# Patient Record
Sex: Female | Born: 1970 | Race: White | Hispanic: No | Marital: Single | Smoking: Never smoker
Health system: Southern US, Community
[De-identification: ages and names within clinical notes are randomized; demographics above are authoritative.]

## PROBLEM LIST (undated history)

## (undated) DIAGNOSIS — F419 Anxiety disorder, unspecified: Secondary | ICD-10-CM

## (undated) DIAGNOSIS — R32 Unspecified urinary incontinence: Secondary | ICD-10-CM

## (undated) DIAGNOSIS — N870 Mild cervical dysplasia: Secondary | ICD-10-CM

## (undated) DIAGNOSIS — N840 Polyp of corpus uteri: Secondary | ICD-10-CM

## (undated) DIAGNOSIS — E349 Endocrine disorder, unspecified: Secondary | ICD-10-CM

## (undated) DIAGNOSIS — G43909 Migraine, unspecified, not intractable, without status migrainosus: Secondary | ICD-10-CM

## (undated) DIAGNOSIS — IMO0002 Reserved for concepts with insufficient information to code with codable children: Secondary | ICD-10-CM

## (undated) HISTORY — DX: Endocrine disorder, unspecified: E34.9

## (undated) HISTORY — DX: Anxiety disorder, unspecified: F41.9

## (undated) HISTORY — PX: WISDOM TOOTH EXTRACTION: SHX21

## (undated) HISTORY — DX: Migraine, unspecified, not intractable, without status migrainosus: G43.909

## (undated) HISTORY — DX: Reserved for concepts with insufficient information to code with codable children: IMO0002

## (undated) HISTORY — DX: Polyp of corpus uteri: N84.0

## (undated) HISTORY — DX: Mild cervical dysplasia: N87.0

## (undated) HISTORY — DX: Unspecified urinary incontinence: R32

---

## 2002-04-23 ENCOUNTER — Other Ambulatory Visit: Admission: RE | Admit: 2002-04-23 | Discharge: 2002-04-23 | Payer: Self-pay | Admitting: Obstetrics and Gynecology

## 2003-04-29 ENCOUNTER — Other Ambulatory Visit: Admission: RE | Admit: 2003-04-29 | Discharge: 2003-04-29 | Payer: Self-pay | Admitting: Obstetrics and Gynecology

## 2004-05-04 ENCOUNTER — Other Ambulatory Visit: Admission: RE | Admit: 2004-05-04 | Discharge: 2004-05-04 | Payer: Self-pay | Admitting: Obstetrics and Gynecology

## 2005-07-08 ENCOUNTER — Other Ambulatory Visit: Admission: RE | Admit: 2005-07-08 | Discharge: 2005-07-08 | Payer: Self-pay | Admitting: Obstetrics and Gynecology

## 2007-02-06 ENCOUNTER — Other Ambulatory Visit: Admission: RE | Admit: 2007-02-06 | Discharge: 2007-02-06 | Payer: Self-pay | Admitting: Obstetrics and Gynecology

## 2010-05-06 ENCOUNTER — Ambulatory Visit (HOSPITAL_COMMUNITY)
Admission: RE | Admit: 2010-05-06 | Discharge: 2010-05-06 | Disposition: A | Discharge status: Home / Self Care | Payer: 59 | Source: Ambulatory Visit | Attending: Family Medicine | Admitting: Family Medicine

## 2010-05-06 ENCOUNTER — Encounter: Payer: Self-pay | Admitting: Family Medicine

## 2010-05-06 DIAGNOSIS — M7989 Other specified soft tissue disorders: Secondary | ICD-10-CM | POA: Insufficient documentation

## 2010-05-06 DIAGNOSIS — M79609 Pain in unspecified limb: Secondary | ICD-10-CM | POA: Insufficient documentation

## 2011-02-16 ENCOUNTER — Ambulatory Visit (INDEPENDENT_AMBULATORY_CARE_PROVIDER_SITE_OTHER): Payer: 59

## 2011-02-16 DIAGNOSIS — Z13220 Encounter for screening for lipoid disorders: Secondary | ICD-10-CM

## 2011-02-16 DIAGNOSIS — Z139 Encounter for screening, unspecified: Secondary | ICD-10-CM

## 2011-02-16 DIAGNOSIS — M999 Biomechanical lesion, unspecified: Secondary | ICD-10-CM

## 2011-06-17 ENCOUNTER — Other Ambulatory Visit: Payer: Self-pay | Admitting: Family Medicine

## 2011-06-17 ENCOUNTER — Ambulatory Visit (INDEPENDENT_AMBULATORY_CARE_PROVIDER_SITE_OTHER): Payer: 59 | Admitting: Family Medicine

## 2011-06-17 VITALS — BP 103/64 | HR 89 | Temp 98.1°F | Resp 18 | Ht 67.0 in | Wt 126.8 lb

## 2011-06-17 DIAGNOSIS — Z111 Encounter for screening for respiratory tuberculosis: Secondary | ICD-10-CM

## 2011-06-17 DIAGNOSIS — Z130 Encounter for screening for diseases of the blood and blood-forming organs and certain disorders involving the immune mechanism: Secondary | ICD-10-CM

## 2011-06-17 DIAGNOSIS — Z13228 Encounter for screening for other metabolic disorders: Secondary | ICD-10-CM

## 2011-06-17 DIAGNOSIS — Z0000 Encounter for general adult medical examination without abnormal findings: Secondary | ICD-10-CM

## 2011-06-17 DIAGNOSIS — Z1231 Encounter for screening mammogram for malignant neoplasm of breast: Secondary | ICD-10-CM

## 2011-06-17 LAB — POCT CBC
Granulocyte percent: 66.8 %G (ref 37–80)
HCT, POC: 42.6 % (ref 37.7–47.9)
Hemoglobin: 13.5 g/dL (ref 12.2–16.2)
Lymph, poc: 1.9 (ref 0.6–3.4)
MCH, POC: 30.5 pg (ref 27–31.2)
MCHC: 31.7 g/dL — AB (ref 31.8–35.4)
MCV: 96.3 fL (ref 80–97)
MID (cbc): 0.5 (ref 0–0.9)
MPV: 10.1 fL (ref 0–99.8)
POC Granulocyte: 4.8 (ref 2–6.9)
POC LYMPH PERCENT: 26.2 %L (ref 10–50)
POC MID %: 7 %M (ref 0–12)
Platelet Count, POC: 246 10*3/uL (ref 142–424)
RBC: 4.42 M/uL (ref 4.04–5.48)
RDW, POC: 13.1 %
WBC: 7.2 10*3/uL (ref 4.6–10.2)

## 2011-06-17 LAB — POCT URINALYSIS DIPSTICK
Bilirubin, UA: NEGATIVE
Glucose, UA: NEGATIVE
Ketones, UA: NEGATIVE
Leukocytes, UA: NEGATIVE
Nitrite, UA: NEGATIVE
Protein, UA: NEGATIVE
Spec Grav, UA: 1.01
Urobilinogen, UA: 0.2
pH, UA: 7

## 2011-06-17 NOTE — Progress Notes (Signed)
[facility]:  Office Visit  Chief Complaint:  Chief Complaint  Patient presents with  . Annual Exam    HPI: Ashley [name] is a 41 y.o. female who complains of:  1. 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 Also needs labs for annual without pap, last pap was normal 01/2011,  10/15/09  Past Medical History  Diagnosis Date  . Migraines    History reviewed. No pertinent past surgical history. History   Social History  . Marital Status: Unknown    Spouse Name: N/A    Number of Children: N/A  . Years of Education: N/A   Social History Main Topics  . Smoking status: Never Smoker   . Smokeless tobacco: None  . Alcohol Use: No  . Drug Use: No  . Sexually Active: None   Other Topics Concern  . None   Social History Narrative  . None   Family History  Problem Relation Age of Onset  . Bipolar disorder Mother   . Parkinsonism Father    Allergies not on file Prior to Admission medications   Not on File     ROS: The patient denies fevers, chills, night sweats, unintentional weight loss, chest pain, palpitations, wheezing, dyspnea on exertion, nausea, vomiting, abdominal pain, dysuria, hematuria, melena, numbness, weakness, or tingling.  All other systems have been reviewed and were otherwise negative with the exception of those mentioned in the HPI and as above.    PHYSICAL EXAM: Filed Vitals:   06/17/11 1307  BP: 103/64  Pulse: 89  Temp: 98.1 F (36.7 C)  Resp: 18   Filed Vitals:   06/17/11 1307  Height: 5\' 7"  (1.702 m)  Weight: 126 lb 12.8 oz (57.516 kg)   Body mass index is 19.86 kg/(m^2).  General: Alert, no acute distress HEENT:  Normocephalic, atraumatic, oropharynx patent. EOMI, PERRLA, fundoscopic exam nl.  Cardiovascular:  Regular rate and rhythm, no rubs murmurs or gallops.  No Carotid bruits, radial pulse intact. No pedal edema.  Respiratory: Clear to auscultation bilaterally.  No wheezes, rales, or rhonchi.   No cyanosis, no use of accessory musculature GI: No organomegaly, abdomen is soft and non-tender, positive bowel sounds.  No masses. Skin: No rashes. Neurologic: Facial musculature symmetric. Psychiatric: Patient is appropriate throughout our interaction. Lymphatic: No cervical lymphadenopathy.  Musculoskeletal: Gait intact. GU/Breast exam deferred   LABS: Results for orders placed in visit on 06/17/11  POCT CBC      Component Value Range   WBC 7.2  4.6 - 10.2 (K/uL)   Lymph, poc 1.9  0.6 - 3.4    POC LYMPH PERCENT 26.2  10 - 50 (%L)   MID (cbc) 0.5  0 - 0.9    POC MID % 7.0  0 - 12 (%M)   POC Granulocyte 4.8  2 - 6.9    Granulocyte percent 66.8  37 - 80 (%G)   RBC 4.42  4.04 - 5.48 (M/uL)   Hemoglobin 13.5  12.2 - 16.2 (g/dL)   HCT, POC 16.1  09.6 - 47.9 (%)   MCV 96.3  80 - 97 (fL)   MCH, POC 30.5  27 - 31.2 (pg)   MCHC 31.7 (*) 31.8 - 35.4 (g/dL)   RDW, POC 04.5     Platelet Count, POC 246  142 - 424 (K/uL)   MPV 10.1  0 - 99.8 (fL)  POCT URINALYSIS DIPSTICK      Component Value Range   Color, UA yellow  Clarity, UA clear     Glucose, UA neg     Bilirubin, UA neg     Ketones, UA neg     Spec Grav, UA 1.010     Blood, UA trace     pH, UA 7.0     Protein, UA neg     Urobilinogen, UA 0.2     Nitrite, UA neg     Leukocytes, UA Negative       EKG/XRAY:   Primary read interpreted by Dr. Conley Rolls at [facility].   ASSESSMENT/PLAN: Encounter Diagnoses  Name Primary?  . Annual physical exam Yes  . Screening-pulmonary TB   . Screening for other and unspecified endocrine, nutritional, metabolic, and immunity disorders    Pending labs CMP, Hep B surface Ab Patient to return for TB results in 48-72 hours Screening mammogram-telephone number given to patient. She will make her own appt Forms filled out for MA school    Heena Woodbury PHUONG, DO 06/17/2011 3:00 PM

## 2011-06-18 LAB — COMPREHENSIVE METABOLIC PANEL
ALT: 48 U/L — ABNORMAL HIGH (ref 0–35)
AST: 38 U/L — ABNORMAL HIGH (ref 0–37)
Albumin: 4.5 g/dL (ref 3.5–5.2)
Alkaline Phosphatase: 60 U/L (ref 39–117)
Glucose, Bld: 87 mg/dL (ref 70–99)
Potassium: 4.1 mEq/L (ref 3.5–5.3)
Sodium: 137 mEq/L (ref 135–145)
Total Bilirubin: 0.4 mg/dL (ref 0.3–1.2)
Total Protein: 6.8 g/dL (ref 6.0–8.3)

## 2011-06-18 LAB — COMPREHENSIVE METABOLIC PANEL WITH GFR
BUN: 11 mg/dL (ref 6–23)
CO2: 28 meq/L (ref 19–32)
Calcium: 9.6 mg/dL (ref 8.4–10.5)
Chloride: 101 meq/L (ref 96–112)
Creat: 0.73 mg/dL (ref 0.50–1.10)

## 2011-06-18 LAB — HEPATITIS B SURFACE ANTIBODY, QUANTITATIVE: Hep B S AB Quant (Post): 5.5 m[IU]/mL

## 2011-06-20 ENCOUNTER — Ambulatory Visit (INDEPENDENT_AMBULATORY_CARE_PROVIDER_SITE_OTHER): Payer: 59 | Admitting: Physician Assistant

## 2011-06-20 ENCOUNTER — Encounter: Payer: Self-pay | Admitting: Physician Assistant

## 2011-06-20 VITALS — BP 99/64 | HR 71 | Temp 97.8°F | Resp 16 | Ht 67.0 in | Wt 127.0 lb

## 2011-06-20 DIAGNOSIS — Z23 Encounter for immunization: Secondary | ICD-10-CM

## 2011-06-20 DIAGNOSIS — Z111 Encounter for screening for respiratory tuberculosis: Secondary | ICD-10-CM

## 2011-06-20 DIAGNOSIS — R7989 Other specified abnormal findings of blood chemistry: Secondary | ICD-10-CM

## 2011-06-20 LAB — TB SKIN TEST
Induration: 0
TB Skin Test: NEGATIVE mm

## 2011-06-20 NOTE — Progress Notes (Signed)
Patient presents for PPD reading and to pick up results of recent immunization review/completed forms for school.    The titer reveals lack of immunity to Hep B.  She has has 3 doses of vaccine, but there were several years between the second and third doses.  Additionally, she notes that there was a problem with the results of her last pap test, performed by Dr. Patsy Lager in 01/2011. She requests those today.  O: Vital signs noted. Well-developed, well nourished WF who is awake, alert and oriented, in NAD. HEENT: [place]/AT, sclera and conjunctiva are clear.   Skin: warm and dry without rash.  PPD 0 mm induration-Negative.  Pap results located.  ASCUS, HPV Negative, GC Negative, CT Negative.  A/P: 1. TB screening-negative. 2. Hepatitis B vaccine given as booster. Repeat titer in 4 weeks.  If still not immune, give 2 additional doses of vaccine and repeat titer. 3. Elevated LFTs. As the elevations are mild, we will repeat them at the 4 week repeat titer visit.  If still elevated, pursue additional evaluation.

## 2011-06-20 NOTE — Patient Instructions (Signed)
We'll repeat your CMET to re-check your liver enzymes when we re-peat the Hepatitis B titer in 4 weeks.  If they are still elevated, we will investigate further.

## 2011-06-22 ENCOUNTER — Ambulatory Visit
Admission: RE | Admit: 2011-06-22 | Discharge: 2011-06-22 | Disposition: A | Discharge status: Home / Self Care | Payer: 59 | Source: Ambulatory Visit | Attending: Family Medicine | Admitting: Family Medicine

## 2011-06-22 DIAGNOSIS — Z1231 Encounter for screening mammogram for malignant neoplasm of breast: Secondary | ICD-10-CM

## 2011-06-24 ENCOUNTER — Encounter: Payer: 59 | Admitting: Family Medicine

## 2011-07-08 ENCOUNTER — Encounter: Payer: 59 | Admitting: Family Medicine

## 2011-09-10 ENCOUNTER — Ambulatory Visit: Payer: Self-pay | Admitting: Physician Assistant

## 2011-09-10 VITALS — BP 108/62 | HR 78 | Temp 98.1°F | Resp 16 | Ht 67.0 in | Wt 127.2 lb

## 2011-09-10 DIAGNOSIS — R7401 Elevation of levels of liver transaminase levels: Secondary | ICD-10-CM

## 2011-09-10 DIAGNOSIS — Z113 Encounter for screening for infections with a predominantly sexual mode of transmission: Secondary | ICD-10-CM

## 2011-09-10 DIAGNOSIS — R7402 Elevation of levels of lactic acid dehydrogenase (LDH): Secondary | ICD-10-CM

## 2011-09-10 DIAGNOSIS — R748 Abnormal levels of other serum enzymes: Secondary | ICD-10-CM

## 2011-09-10 LAB — COMPREHENSIVE METABOLIC PANEL
ALT: 27 U/L (ref 0–35)
AST: 25 U/L (ref 0–37)
Albumin: 4.4 g/dL (ref 3.5–5.2)
Alkaline Phosphatase: 59 U/L (ref 39–117)
BUN: 8 mg/dL (ref 6–23)
CO2: 28 mEq/L (ref 19–32)
Calcium: 9.5 mg/dL (ref 8.4–10.5)
Chloride: 100 mEq/L (ref 96–112)
Creat: 0.72 mg/dL (ref 0.50–1.10)
Glucose, Bld: 81 mg/dL (ref 70–99)
Potassium: 4.4 mEq/L (ref 3.5–5.3)
Sodium: 139 mEq/L (ref 135–145)
Total Bilirubin: 0.5 mg/dL (ref 0.3–1.2)
Total Protein: 6.6 g/dL (ref 6.0–8.3)

## 2011-09-10 NOTE — Progress Notes (Signed)
  Subjective:    Patient ID: Ashley [name], female    DOB: [birth year], 41 y.o.   MRN: [id number]  HPI Patient presents for Hepatits B repeat titer. She had one in 2011 that showed she was not immune. Has since had the series, although there was 1 year between first and second doses. 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    Also wants her ears checked because she has noticed ear wax on stethoscope. Denies otalgia or URI symptoms.   She also would like repeat of CMET due to slightly elevated LFT's at last complete physical.     Review of Systems  All other systems reviewed and are negative.       Objective:   Physical Exam  Constitutional: She is oriented to person, place, and time. She appears well-developed and well-nourished.  HENT:  Head: Normocephalic and atraumatic.  Right Ear: Hearing, tympanic membrane, external ear and ear canal normal.  Left Ear: Hearing, tympanic membrane, external ear and ear canal normal.  Mouth/Throat: No oropharyngeal exudate.       Minimal cerumen bilaterally. No impaction.   Neck: Normal range of motion.  Cardiovascular: Normal rate, regular rhythm and normal heart sounds.   Pulmonary/Chest: Effort normal and breath sounds normal.  Lymphadenopathy:    She has no cervical adenopathy.  Neurological: She is alert and oriented to person, place, and time.  Psychiatric: She has a normal mood and affect. Her behavior is normal. Judgment and thought content normal.          Assessment & Plan:   1. Elevated liver enzymes  Will repeat CMET today.  Comprehensive metabolic panel  2. Screening examination for venereal disease  Hepatitis B surface antibody

## 2011-09-11 LAB — HEPATITIS B SURFACE ANTIBODY, QUANTITATIVE: Hep B S AB Quant (Post): 1000 m[IU]/mL

## 2011-10-01 ENCOUNTER — Encounter: Payer: Self-pay | Admitting: Family Medicine

## 2011-10-15 ENCOUNTER — Telehealth: Payer: Self-pay

## 2011-10-15 ENCOUNTER — Ambulatory Visit: Payer: Self-pay | Admitting: Emergency Medicine

## 2011-10-15 VITALS — BP 102/57 | HR 82 | Temp 98.6°F | Resp 16 | Ht 67.5 in | Wt 125.0 lb

## 2011-10-15 DIAGNOSIS — N926 Irregular menstruation, unspecified: Secondary | ICD-10-CM

## 2011-10-15 LAB — POCT URINALYSIS DIPSTICK
Bilirubin, UA: NEGATIVE
Ketones, UA: NEGATIVE
Leukocytes, UA: NEGATIVE

## 2011-10-15 LAB — POCT UA - MICROSCOPIC ONLY
Bacteria, U Microscopic: NEGATIVE
Casts, Ur, LPF, POC: NEGATIVE

## 2011-10-15 LAB — POCT WET PREP WITH KOH: Clue Cells Wet Prep HPF POC: NEGATIVE

## 2011-10-15 LAB — POCT URINE PREGNANCY: Preg Test, Ur: NEGATIVE

## 2011-10-15 NOTE — Progress Notes (Signed)
  Subjective:    Patient ID: Ashley [name], female    DOB: Mar 31, 1970, 41 y.o.   MRN: [id number]  HPI    Review of Systems     Objective:   Physical Exam  Genitourinary: Uterus normal. Pelvic exam was performed with patient supine. There is no tenderness or lesion on the right labia. There is no tenderness or lesion on the left labia. Cervix exhibits no motion tenderness and no discharge. There is bleeding (minimal spotting) around the vagina. No vaginal discharge found.          Assessment & Plan:   1. Menstrual irregularity  POCT urinalysis dipstick, POCT urine pregnancy, POCT UA - Microscopic Only, POCT Wet Prep with KOH, GC/chlamydia probe amp, genital  Reassurance provided. Will await GC/CL  Recommend follow up with Gyn to further evaluate cause of menstrual irregularity

## 2011-10-15 NOTE — Telephone Encounter (Signed)
PT IS REQUESTING HER LABS FOR HEP B - SHOWING SHE IS IMMUNE PLEASE [phone]

## 2011-10-15 NOTE — Progress Notes (Signed)
  Subjective:    Patient ID: Ashley [name], female    DOB: May 18, 1970, 41 y.o.   MRN: [id number]  HPI 41 year old female presents with menstrual irregularities. Her menstrual cycle started on July 23rd and has persisted. Patient is sexually active and does not currently using any birth control.  She has no children.  Denies pain.  She does also complain of a cough with slight sore throat for several days.  Last complete physical with pap in Dec 2012.     Review of Systems     Objective:   Physical Exam HEENT exam is unremarkable her throat was clear her neck is supple her chest is clear to auscultation and percussion. The abdomen is soft.        Assessment & Plan:

## 2011-10-16 LAB — GC/CHLAMYDIA PROBE AMP, GENITAL: GC Probe Amp, Genital: NEGATIVE

## 2011-10-18 NOTE — Telephone Encounter (Signed)
LMOM that we got her req on Fri and it is waiting for her at front desk. Advised she may CB if she would like [place] to mail it to her instead.

## 2011-10-20 ENCOUNTER — Telehealth: Payer: Self-pay | Admitting: *Deleted

## 2011-10-20 NOTE — Telephone Encounter (Signed)
Pt wanted to know results on G/C, test is back just needs reviewing. Thank you.

## 2011-10-21 NOTE — Telephone Encounter (Signed)
lmom that tests were negative.

## 2011-10-21 NOTE — Telephone Encounter (Signed)
Please let patient know GC/CL negative

## 2013-04-01 DIAGNOSIS — N840 Polyp of corpus uteri: Secondary | ICD-10-CM

## 2013-04-01 DIAGNOSIS — IMO0002 Reserved for concepts with insufficient information to code with codable children: Secondary | ICD-10-CM

## 2013-04-01 HISTORY — DX: Polyp of corpus uteri: N84.0

## 2013-04-01 HISTORY — DX: Reserved for concepts with insufficient information to code with codable children: IMO0002

## 2013-04-04 ENCOUNTER — Ambulatory Visit: Payer: Self-pay | Admitting: Family Medicine

## 2013-04-04 VITALS — BP 100/70 | HR 73 | Temp 97.9°F | Resp 16 | Ht 67.0 in | Wt 126.0 lb

## 2013-04-04 DIAGNOSIS — N920 Excessive and frequent menstruation with regular cycle: Secondary | ICD-10-CM

## 2013-04-04 DIAGNOSIS — N841 Polyp of cervix uteri: Secondary | ICD-10-CM

## 2013-04-04 NOTE — Progress Notes (Signed)
Subjective: [age] year old lady with history of 6 months of off-and-on vaginal bleeding. She can hardly tell when her period starts or in this. She is probably bleeding about 16 days a month as opposed to her usual 5 day periods. No pain. And bleeding frequently after intercourse. No other major symptoms. Wanted a Pap smear today. [facility]. Had Chlamydia last summer. Has no insurance and prefers excessive testing not done.  Objective: Very anxious lady. No CVA tenderness. Abdomen soft without mass or tenderness. Normal external genitalia. Vaginal mucosa unremarkable. Cervix has a very prominent and very large 1.5-2 cm endocervical polyp which is very friable looking. I could not see the os. I decided not to attempt a Pap smear because of the being afraid we would start uncontrolled bleeding. Bimanual exam unremarkable except for the palpable polyp.  Assessment: Large endocervical polyp, friable  Plan: Refer to gynecology and let them remove the polyp and do the Pap smear. Attempted to photograph it for the patient with her phone, but did not do so successfully  Interestingly enough, her twin sister has a history of having one of these.

## 2013-04-04 NOTE — Patient Instructions (Signed)
We are referring you to a gynecologist for evaluation and removal of the endocervical polyp. You should hear from our referral department in the next few days. If for any reason you do not hear from someone by Tuesday please call back and asked to speak to referral  Avoid intercourse until you see the gynecologist.

## 2013-04-20 ENCOUNTER — Other Ambulatory Visit (HOSPITAL_COMMUNITY)
Admission: RE | Admit: 2013-04-20 | Discharge: 2013-04-20 | Disposition: A | Discharge status: Home / Self Care | Payer: Medicaid Other | Source: Ambulatory Visit | Attending: Gynecology | Admitting: Gynecology

## 2013-04-20 ENCOUNTER — Encounter: Payer: Self-pay | Admitting: Gynecology

## 2013-04-20 ENCOUNTER — Ambulatory Visit (INDEPENDENT_AMBULATORY_CARE_PROVIDER_SITE_OTHER): Payer: Medicaid Other | Admitting: Gynecology

## 2013-04-20 ENCOUNTER — Telehealth: Payer: Self-pay | Admitting: *Deleted

## 2013-04-20 VITALS — BP 100/68 | Ht 67.0 in | Wt 125.0 lb

## 2013-04-20 DIAGNOSIS — N926 Irregular menstruation, unspecified: Secondary | ICD-10-CM

## 2013-04-20 DIAGNOSIS — Z124 Encounter for screening for malignant neoplasm of cervix: Secondary | ICD-10-CM | POA: Insufficient documentation

## 2013-04-20 DIAGNOSIS — N841 Polyp of cervix uteri: Secondary | ICD-10-CM

## 2013-04-20 DIAGNOSIS — Z1151 Encounter for screening for human papillomavirus (HPV): Secondary | ICD-10-CM | POA: Insufficient documentation

## 2013-04-20 DIAGNOSIS — N63 Unspecified lump in unspecified breast: Secondary | ICD-10-CM

## 2013-04-20 DIAGNOSIS — N6310 Unspecified lump in the right breast, unspecified quadrant: Secondary | ICD-10-CM

## 2013-04-20 NOTE — Patient Instructions (Signed)
Office will call you with the pathology report from the polyp. Office will call you to arrange for mammogram and ultrasound of the right breast mass.

## 2013-04-20 NOTE — Progress Notes (Signed)
Patient ID: Ashley [name] Baby, female   DOB: 1970/07/06, 43 y.o.   MRN: [id number] Ashley [name] Clyne 1970/07/06 [id number]    43 y.o.  G0P0 new patient seen in consultation from Dr. Alwyn RenHopper. She saw him complaining of irregular bleeding and on his exam a large cervical polyp was noted and she was referred for further evaluation. Patient notes regular menses monthly but spotting in between over the last several months. No pain or other symptoms.  Past medical history,surgical history, problem list, medications, allergies, family history and social history were all reviewed and documented in the EPIC chart.  ROS:  Performed and pertinent positives and negatives are included in the history, assessment and plan .  Exam: Sherrilyn RistKari assistant Filed Vitals:   04/20/13 1441  BP: 100/68  Height: 5\' 7"  (1.702 m)  Weight: 125 lb (56.7 kg)   General appearance  Normal Skin grossly normal Head/Neck normal with no cervical or supraclavicular adenopathy thyroid normal Lungs  clear Cardiac RR, without RMG Abdominal  soft, nontender, without masses, organomegaly or hernia Breasts  examined lying and sitting. Left without masses, retractions, discharge or axillary adenopathy. Right with firm well-circumscribed 1.5 cm nodular mass 3 to 4:00 position 1 fingerbreadth off the nipple. No overlying skin changes, nipple discharge or axillary adenopathy. Physical Exam  Respiratory:       Pelvic  Ext/BUS/vagina  normal  Cervix  with large endocervical polyp extruding from the os. Pap smear done.  Endocervical polyp was grasped with a ring forcep and removed without difficulty. Scant bleeding afterwards. Specimen sent to pathology.  Uterus  anteverted, normal size, shape and contour, midline and mobile nontender   Adnexa  Without masses or tenderness    Anus and perineum  Normal   Rectovaginal  Normal sphincter tone without palpated masses or tenderness.    Assessment/Plan:  43 y.o. G0P0 new  patient  1. Endocervical polyp. I think this accounts for her irregular spotting between her cycles. Recommend keeping a menstrual calendar now and if regular menses without spotting will follow. If irregular spotting continues then she'll represent for further evaluation rule out endometrial abnormalities. Patient will followup for the pathology results from the biopsy as well as her Pap smear results. 2. Right breast mass as outlined above. Feels like a fibroadenoma. Patient notes in retrospect that she had noticed this before.  Last mammogram 05/2011 normal. We'll start with diagnostic mammography and ultrasound. The importance of followup was stressed to her and she knows my office will arrange the appointment. 3. Pap smear 2013. Pap smear repeated today. She gives no history of abnormal Pap smears 4. Contraception. Patient using condoms. Failure risks discussed and availability of plan B. reviewed. Discussed alternatives to include pill patch ring Depo-Provera IUD. Patient had tried birth control pills in the past but did not like the way hormones make her feel. She is comfortable continuing with the condoms accepting the failure risk. 5. Health maintenance. No lab work done as this is all done through her primary physician'[name] office. Followup for pathology results and Pap smear. Followup if irregular bleeding continues.   Note: This document was prepared with digital dictation and possible smart phrase technology. Any transcriptional errors that result from this process are unintentional.   Dara LordsFONTAINE,Laylani Pudwill P MD, 3:25 PM 04/20/2013

## 2013-04-20 NOTE — Addendum Note (Signed)
Addended by: Richardson ChiquitoWILKINSON, KARI S on: 04/20/2013 03:40 PM   Modules accepted: Orders

## 2013-04-20 NOTE — Telephone Encounter (Signed)
Orders placed breast center will contact pt to schedule.  

## 2013-04-20 NOTE — Telephone Encounter (Signed)
Message copied by Aura CampsWEBB, Querida Beretta L on Fri Apr 20, 2013  3:52 PM ------      Message from: Dara LordsFONTAINE, TIMOTHY P      Created: Fri Apr 20, 2013  3:32 PM       Schedule diagnostic mammography and ultrasound reference right breast mass 3 to 4:00 position off the areola ------

## 2013-04-24 ENCOUNTER — Other Ambulatory Visit: Payer: Self-pay | Admitting: Gynecology

## 2013-04-24 DIAGNOSIS — N840 Polyp of corpus uteri: Secondary | ICD-10-CM

## 2013-04-27 NOTE — Telephone Encounter (Signed)
appt 05/07/13 @ 3:30 pm

## 2013-05-02 ENCOUNTER — Encounter: Payer: Self-pay | Admitting: Gynecology

## 2013-05-02 ENCOUNTER — Telehealth: Payer: Self-pay

## 2013-05-02 NOTE — Telephone Encounter (Signed)
Message copied by Keenan BachelorANNAS, Dinah R on Wed May 02, 2013  4:53 PM ------      Message from: Dara LordsFONTAINE, TIMOTHY P      Created: Wed May 02, 2013  8:35 AM       Tell patient that her Pap smear showed mild atypia. The HPV screen was negative and by protocol we will repeat her Pap smear in one year. It also showed some endometrial cells which I think is from her endometrial polyp that we removed. We will further evaluate when she comes in for the sonohysterogram. ------

## 2013-05-02 NOTE — Telephone Encounter (Signed)
[facility] needed her to call with Day One of menses. She said she has no idea when she will start because periods are so irregular since she has been dealing with irregular bleeding.  I told her I would see what you recommended.

## 2013-05-03 NOTE — Telephone Encounter (Signed)
I would have her call when her period starts. And then we can fit her in for the sonohysterogram. It is not an emergency for the sonohysterogram so if it takes a month or so that's okay  We were also arranging ultrasound mammogram for the lump in her right breast. I want to make sure that she is following up for that also.

## 2013-05-03 NOTE — Telephone Encounter (Signed)
Left detailed message on her cell phone with answer.

## 2013-05-03 NOTE — Telephone Encounter (Signed)
Breast u/s follow-up is scheduled for March 11.  [facility]

## 2013-05-07 ENCOUNTER — Other Ambulatory Visit: Payer: Self-pay

## 2013-05-09 ENCOUNTER — Ambulatory Visit
Admission: RE | Admit: 2013-05-09 | Discharge: 2013-05-09 | Disposition: A | Discharge status: Home / Self Care | Payer: Medicaid Other | Source: Ambulatory Visit | Attending: Gynecology | Admitting: Gynecology

## 2013-05-09 DIAGNOSIS — N6310 Unspecified lump in the right breast, unspecified quadrant: Secondary | ICD-10-CM

## 2013-05-14 ENCOUNTER — Telehealth: Payer: Self-pay | Admitting: *Deleted

## 2013-05-14 NOTE — Telephone Encounter (Signed)
Message copied by Aura CampsWEBB, JENNIFER L on Mon May 14, 2013  8:53 AM ------      Message from: Dara LordsFONTAINE, TIMOTHY P      Created: Wed May 09, 2013  3:14 PM       I would notify radiology that their report impression status left breast cyst but the patient had a right breast mass and I think they confused the side in the final report ------

## 2013-05-14 NOTE — Telephone Encounter (Signed)
Spoke to Ashley [name] at breast center and she will relay to have error fixed.

## 2013-05-16 ENCOUNTER — Telehealth: Payer: Self-pay

## 2013-05-16 NOTE — Telephone Encounter (Signed)
Left message for patient to call me.  Dr. Velvet BatheFOlegario Messier- Kathy at [facility] said there is a note on her chart and she is going to have the radiologist re-dictate the corrected report.

## 2013-05-16 NOTE — Telephone Encounter (Signed)
Yes, we need to call the patient. If she thinks she is pregnant then she should come here to verify that and we can discuss ongoing care. If her menses started and she is not pregnant then she needs to call and followup for the mammogram to complete the evaluation of her breasts. I still do not see where they have corrected the report where the ultrasound mentioned a left breast cyst in the final impression but are describing a right breast cyst on the descriptive part at the report. She did have a right breast mass on clinical exam and again I think a confused the side on final description.

## 2013-05-16 NOTE — Telephone Encounter (Signed)
When patient came for the diagnostic mammogram you ordered she told them there was a chance she was pregnant so they did not do diagnostic mammo they only did u/s of breast.  They advised her that as soon as she found out, if test was negative, she should come for mammo. Olegario MessierKathy was calling today to see if you were aware regarding the possible pregnancy and when she might be having a test. I told her last visit not mentioned.    Olegario MessierKathy suggested I might call patient and inquire regarding pregnancy and encourage her if not pregnant that diagnostic mammo is needed. She said they will be sending her a letter indicating additional views needed.  Do you want me to contact patient?

## 2013-05-17 NOTE — Telephone Encounter (Signed)
Patient called. She said that her period started today. She is ready to schedule [facility]. I stressed to her the importance of having the diagnostic mammo for completeness of that breast study.  She said she will call them back and schedule that. I called the [facility] to let tech, Olegario MessierKathy know.

## 2013-05-21 ENCOUNTER — Other Ambulatory Visit: Payer: Self-pay

## 2013-05-21 ENCOUNTER — Other Ambulatory Visit: Payer: Self-pay | Admitting: Gynecology

## 2013-05-21 DIAGNOSIS — Z1231 Encounter for screening mammogram for malignant neoplasm of breast: Secondary | ICD-10-CM

## 2013-06-01 ENCOUNTER — Ambulatory Visit
Admission: RE | Admit: 2013-06-01 | Discharge: 2013-06-01 | Disposition: A | Discharge status: Home / Self Care | Payer: Medicaid Other | Source: Ambulatory Visit

## 2013-06-01 DIAGNOSIS — Z1231 Encounter for screening mammogram for malignant neoplasm of breast: Secondary | ICD-10-CM

## 2013-06-05 ENCOUNTER — Other Ambulatory Visit: Payer: Self-pay | Admitting: Gynecology

## 2013-06-05 DIAGNOSIS — R928 Other abnormal and inconclusive findings on diagnostic imaging of breast: Secondary | ICD-10-CM

## 2013-06-18 ENCOUNTER — Ambulatory Visit
Admission: RE | Admit: 2013-06-18 | Discharge: 2013-06-18 | Disposition: A | Discharge status: Home / Self Care | Payer: Medicaid Other | Source: Ambulatory Visit | Attending: Gynecology | Admitting: Gynecology

## 2013-06-18 ENCOUNTER — Ambulatory Visit: Payer: Medicaid Other | Admitting: Gynecology

## 2013-06-18 ENCOUNTER — Other Ambulatory Visit: Payer: Medicaid Other

## 2013-06-18 DIAGNOSIS — R928 Other abnormal and inconclusive findings on diagnostic imaging of breast: Secondary | ICD-10-CM

## 2013-06-19 ENCOUNTER — Ambulatory Visit: Payer: Self-pay | Admitting: Physician Assistant

## 2013-06-19 ENCOUNTER — Telehealth: Payer: Self-pay

## 2013-06-19 VITALS — BP 100/58 | HR 60 | Temp 98.1°F | Resp 16 | Ht 66.0 in | Wt 124.6 lb

## 2013-06-19 DIAGNOSIS — N390 Urinary tract infection, site not specified: Secondary | ICD-10-CM

## 2013-06-19 DIAGNOSIS — R350 Frequency of micturition: Secondary | ICD-10-CM

## 2013-06-19 DIAGNOSIS — R109 Unspecified abdominal pain: Secondary | ICD-10-CM

## 2013-06-19 LAB — POCT URINALYSIS DIPSTICK
Bilirubin, UA: NEGATIVE
Glucose, UA: NEGATIVE
Ketones, UA: NEGATIVE
Nitrite, UA: NEGATIVE
Protein, UA: 30
Spec Grav, UA: 1.01
Urobilinogen, UA: 0.2
pH, UA: 7

## 2013-06-19 LAB — POCT UA - MICROSCOPIC ONLY
Casts, Ur, LPF, POC: NEGATIVE
Crystals, Ur, HPF, POC: NEGATIVE
Mucus, UA: NEGATIVE
Yeast, UA: NEGATIVE

## 2013-06-19 MED ORDER — NITROFURANTOIN MONOHYD MACRO 100 MG PO CAPS: 100.0000 mg | ORAL_CAPSULE | Freq: Two times a day (BID) | ORAL | Status: DC

## 2013-06-19 MED ORDER — PHENAZOPYRIDINE HCL 200 MG PO TABS: 200.0000 mg | ORAL_TABLET | Freq: Three times a day (TID) | ORAL | Status: DC | PRN

## 2013-06-19 NOTE — Telephone Encounter (Signed)
Pt gave us a call back, she changed her mind and has decided to do the urine culture. We still have the urine and Maralyn SagoSarah said it was ok to order it. Malvin JohnsFYI Elizabeth

## 2013-06-19 NOTE — Progress Notes (Signed)
Subjective:    Patient ID: Ashley [name], female    DOB: [birth year], 43 y.o.   MRN: [id number]  HPI   Ms. Ashley [name] is a pleasant 43 yr old female here with concern for illness.  Woke up last night with lower abd pain, dysuria.  Some frequency last night, but less so today.  Denies hematuria.  Does not recall any prior UTIs.  +low back pain today.  No FC, NV.  Denies vaginal symptoms.  No concern for STI, declines testing.  Has not used anything for symptoms.     Review of Systems  Constitutional: Negative for fever and chills.  Respiratory: Negative.   Gastrointestinal: Positive for abdominal pain (lower). Negative for nausea and vomiting.  Genitourinary: Positive for dysuria and frequency. Negative for flank pain, vaginal bleeding and vaginal discharge.  Musculoskeletal: Positive for back pain.       Objective:   Physical Exam  Vitals reviewed. Constitutional: She is oriented to person, place, and time. She appears well-developed and well-nourished. No distress.  HENT:  Head: Normocephalic and atraumatic.  Eyes: Conjunctivae are normal. No scleral icterus.  Cardiovascular: Normal rate, regular rhythm and normal heart sounds.   Pulmonary/Chest: Effort normal and breath sounds normal. She has no wheezes. She has no rales.  Abdominal: Soft. Bowel sounds are normal. There is tenderness in the suprapubic area. There is no CVA tenderness.  Neurological: She is alert and oriented to person, place, and time.  Skin: Skin is warm and dry.  Psychiatric: Her mood appears anxious. Her speech is delayed. She is withdrawn.    Results for orders placed in visit on 06/19/13  POCT UA - MICROSCOPIC ONLY      Result Value Ref Range   WBC, Ur, HPF, POC 10-15 with clumps     RBC, urine, microscopic 4-6     Bacteria, U Microscopic trace     Mucus, UA neg     Epithelial cells, urine per micros 0-4     Crystals, Ur, HPF, POC neg     Casts, Ur, LPF, POC neg     Yeast, UA neg    POCT URINALYSIS  DIPSTICK      Result Value Ref Range   Color, UA yellow     Clarity, UA clear     Glucose, UA neg     Bilirubin, UA neg     Ketones, UA neg     Spec Grav, UA 1.010     Blood, UA moderate     pH, UA 7.0     Protein, UA 30     Urobilinogen, UA 0.2     Nitrite, UA neg     Leukocytes, UA moderate (2+)         Assessment & Plan:  UTI (urinary tract infection) - Plan: nitrofurantoin, macrocrystal-monohydrate, (MACROBID) 100 MG capsule, phenazopyridine (PYRIDIUM) 200 MG tablet  Frequent urination - Plan: POCT UA - Microscopic Only, POCT urinalysis dipstick, phenazopyridine (PYRIDIUM) 200 MG tablet  Abdominal pain, unspecified site - Plan: POCT UA - Microscopic Only, POCT urinalysis dipstick   Ms. Ashley [name] is a pleasant 43 yr old female with UTI.  Will treat empirically with macrobid x 5 days.  Pt declines cx due to cost.  May use pyridium if needed for the next 2 days.  Push fluids.  Discussed RTC precautions  Pt to call or RTC if worsening or not improving  E. Frances FurbishElizabeth Rylyn Zawistowski MHS, PA-C [facility] 4/21/20155:26  PM

## 2013-06-19 NOTE — Patient Instructions (Signed)
The antibiotic prescribed today is for your present infection only. It is very important to follow the directions for the medication prescribed. Antibiotics are generally given for a specified period of time (7-10 days, for example) to be taken at specific intervals (every 4, 6, 8 or 12 hours). This is necessary to keep the right amount of the medication in the bloodstream. Too much of the medication may cause an adverse reaction, too little may not be completely effective.  To clear your infection completely, continue taking the antibiotic for the full time of treatment, even if you begin to feel better after a few days.  If you miss a dose of the antibiotic, take it as soon as possible. Then go back to your regular dosing schedule. However, don't double up doses.    Begin taking the antibiotic as directed.  Be sure to finish the full course  Use the Pyridium if needed for symptoms (will turn your urine a red/orange color)  Drink plenty of fluids (water is best!)   Please let us know if any symptoms are worsening or not improving   Urinary Tract Infection Urinary tract infections (UTIs) can develop anywhere along your urinary tract. Your urinary tract is your body's drainage system for removing wastes and extra water. Your urinary tract includes two kidneys, two ureters, a bladder, and a urethra. Your kidneys are a pair of bean-shaped organs. Each kidney is about the size of your fist. They are located below your ribs, one on each side of your spine. CAUSES Infections are caused by microbes, which are microscopic organisms, including fungi, viruses, and bacteria. These organisms are so small that they can only be seen through a microscope. Bacteria are the microbes that most commonly cause UTIs. SYMPTOMS  Symptoms of UTIs may vary by age and gender of the patient and by the location of the infection. Symptoms in young women typically include a frequent and intense urge to urinate and a painful,  burning feeling in the bladder or urethra during urination. Older women and men are more likely to be tired, shaky, and weak and have muscle aches and abdominal pain. A fever may mean the infection is in your kidneys. Other symptoms of a kidney infection include pain in your back or sides below the ribs, nausea, and vomiting. DIAGNOSIS To diagnose a UTI, your caregiver will ask you about your symptoms. Your caregiver also will ask to provide a urine sample. The urine sample will be tested for bacteria and white blood cells. White blood cells are made by your body to help fight infection. TREATMENT  Typically, UTIs can be treated with medication. Because most UTIs are caused by a bacterial infection, they usually can be treated with the use of antibiotics. The choice of antibiotic and length of treatment depend on your symptoms and the type of bacteria causing your infection. HOME CARE INSTRUCTIONS  If you were prescribed antibiotics, take them exactly as your caregiver instructs you. Finish the medication even if you feel better after you have only taken some of the medication.  Drink enough water and fluids to keep your urine clear or pale yellow.  Avoid caffeine, tea, and carbonated beverages. They tend to irritate your bladder.  Empty your bladder often. Avoid holding urine for long periods of time.  Empty your bladder before and after sexual intercourse.  After a bowel movement, women should cleanse from front to back. Use each tissue only once. SEEK MEDICAL CARE IF:   You have back  pain.  You develop a fever.  Your symptoms do not begin to resolve within 3 days. SEEK IMMEDIATE MEDICAL CARE IF:   You have severe back pain or lower abdominal pain.  You develop chills.  You have nausea or vomiting.  You have continued burning or discomfort with urination. MAKE SURE YOU:   Understand these instructions.  Will watch your condition.  Will get help right away if you are not  doing well or get worse. Document Released: 11/25/2004 Document Revised: 08/17/2011 Document Reviewed: 03/26/2011 [facility] Patient Information 2014 [place], [place].

## 2013-06-19 NOTE — Addendum Note (Signed)
Addended by: Johnnette LitterARDWELL, Cassian Torelli M on: 06/19/2013 08:00 PM   Modules accepted: Orders

## 2013-06-20 NOTE — Telephone Encounter (Signed)
Perfect. Thanks.

## 2013-06-22 LAB — URINE CULTURE

## 2013-07-18 ENCOUNTER — Emergency Department (INDEPENDENT_AMBULATORY_CARE_PROVIDER_SITE_OTHER)
Admission: EM | Admit: 2013-07-18 | Discharge: 2013-07-18 | Disposition: A | Discharge status: Home / Self Care | Payer: Self-pay | Source: Home / Self Care

## 2013-07-18 ENCOUNTER — Encounter (HOSPITAL_COMMUNITY): Payer: Self-pay | Admitting: Emergency Medicine

## 2013-07-18 DIAGNOSIS — R3989 Other symptoms and signs involving the genitourinary system: Secondary | ICD-10-CM

## 2013-07-18 DIAGNOSIS — R399 Unspecified symptoms and signs involving the genitourinary system: Secondary | ICD-10-CM

## 2013-07-18 LAB — POCT URINALYSIS DIP (DEVICE)
Bilirubin Urine: NEGATIVE
Glucose, UA: NEGATIVE mg/dL
Hgb urine dipstick: NEGATIVE
KETONES UR: NEGATIVE mg/dL
LEUKOCYTES UA: NEGATIVE
NITRITE: NEGATIVE
PH: 7 (ref 5.0–8.0)
Protein, ur: NEGATIVE mg/dL
Specific Gravity, Urine: 1.01 (ref 1.005–1.030)
Urobilinogen, UA: 0.2 mg/dL (ref 0.0–1.0)

## 2013-07-18 LAB — POCT PREGNANCY, URINE: PREG TEST UR: NEGATIVE

## 2013-07-18 MED ORDER — CIPROFLOXACIN HCL 500 MG PO TABS: 500.0000 mg | ORAL_TABLET | Freq: Two times a day (BID) | ORAL | Status: DC

## 2013-07-18 NOTE — ED Notes (Signed)
Pt  Was  Seen    sev  Weeks  Ago  For  uti           She  Said  She    Continues  To  Have  Symptoms  Of  Frequency  And  Low  abd  Pain

## 2013-07-18 NOTE — ED Provider Notes (Signed)
CSN: [id number]     Arrival date & time 07/18/13  1212 History   First MD Initiated Contact with Patient 07/18/13 1400     Chief Complaint  Patient presents with  . Urinary Frequency   (Consider location/radiation/quality/duration/timing/severity/associated sxs/prior Treatment) HPI Comments: 89 y o F with recent hx of UTI due to E.coli strain susceptible to all tested ABX. Was tx with Macrobid and sx's abated. Recently intercourse and now with urinary sx's of frequency  and slight burning with urination.    Past Medical History  Diagnosis Date  . Migraines   . Anxiety   . ASCUS favor benign 04/2013    Negative high risk HPV screen. Recommend repeat Pap smear/HPV one year  . Endometrial polyp 04/2013    Removed in the office extruding from the cervical os   Past Surgical History  Procedure Laterality Date  . Wisdom tooth extraction Bilateral    Family History  Problem Relation Age of Onset  . Bipolar disorder Mother   . Mental illness Mother   . Parkinsonism Father   . Hypertension Father   . Cancer Paternal Grandmother    History  Substance Use Topics  . Smoking status: Never Smoker   . Smokeless tobacco: Not on file  . Alcohol Use: No   OB History   Grav Para Term Preterm Abortions TAB SAB Ect Mult Living   0              Review of Systems  Constitutional: Negative.  Negative for fever.  Respiratory: Negative.   Gastrointestinal: Negative.   Genitourinary: Positive for dysuria, urgency, frequency and pelvic pain. Negative for vaginal bleeding, vaginal discharge, difficulty urinating and vaginal pain.  Musculoskeletal: Negative.     Allergies  Codeine  Home Medications   Prior to Admission medications   Medication Sig Start Date End Date Taking? Authorizing Provider  Multiple Vitamins-Minerals (MULTIVITAMIN WITH MINERALS) tablet Take 1 tablet by mouth daily.    Historical Provider, MD  nitrofurantoin, macrocrystal-monohydrate, (MACROBID) 100 MG capsule Take  1 capsule (100 mg total) by mouth 2 (two) times daily. 06/19/13   Eleanore Delia ChimesE Egan, PA-C  phenazopyridine (PYRIDIUM) 200 MG tablet Take 1 tablet (200 mg total) by mouth 3 (three) times daily as needed for pain. 06/19/13   Eleanore E Egan, PA-C   BP 121/65  Pulse 80  Temp(Src) 97.7 F (36.5 C) (Oral)  Resp 16  SpO2 99%  LMP 06/11/2013 Physical Exam  Nursing note and vitals reviewed. Constitutional: She is oriented to person, place, and time. She appears well-developed and well-nourished. No distress.  Neck: Normal range of motion. Neck supple.  Pulmonary/Chest: Effort normal. No respiratory distress.  Neurological: She is alert and oriented to person, place, and time. She exhibits normal muscle tone.  Skin: Skin is warm and dry.    ED Course  Procedures (including critical care time) Labs Review Labs Reviewed  POCT URINALYSIS DIP (DEVICE)  POCT PREGNANCY, URINE    Imaging Review No results found.   MDM   1. Urinary tract infection symptoms     Does not want a repeat culture due to cost. She later changed her mind and requested one, thus ordered. Since previous Cand S greatest  In vitro efficacy was Cipro will tx with this x 7 d. Plenty of water.     Hayden Rasmussenavid Armari Fussell, NP 07/18/13 1446  Hayden Rasmussenavid Esias Mory, NP 07/18/13 [phone]

## 2013-07-18 NOTE — ED Notes (Signed)
Patient provided urine specimen while in waiting room. specimen in lab

## 2013-07-19 LAB — URINE CULTURE

## 2013-07-19 NOTE — ED Provider Notes (Signed)
Medical screening examination/treatment/procedure(s) were performed by a resident physician or non-physician practitioner and as the supervising physician I was immediately available for consultation/collaboration.  Euclide Granito, MD    Addilyn Satterwhite S Nasiyah Laverdiere, MD 07/19/13 0751 

## 2013-08-08 ENCOUNTER — Other Ambulatory Visit: Payer: Self-pay | Admitting: Gynecology

## 2013-08-08 DIAGNOSIS — N926 Irregular menstruation, unspecified: Secondary | ICD-10-CM

## 2013-08-08 DIAGNOSIS — N840 Polyp of corpus uteri: Secondary | ICD-10-CM

## 2013-08-10 ENCOUNTER — Ambulatory Visit (INDEPENDENT_AMBULATORY_CARE_PROVIDER_SITE_OTHER): Payer: Self-pay | Admitting: Gynecology

## 2013-08-10 ENCOUNTER — Encounter: Payer: Self-pay | Admitting: Gynecology

## 2013-08-10 ENCOUNTER — Other Ambulatory Visit: Payer: Self-pay | Admitting: Gynecology

## 2013-08-10 ENCOUNTER — Ambulatory Visit (INDEPENDENT_AMBULATORY_CARE_PROVIDER_SITE_OTHER): Payer: Self-pay

## 2013-08-10 DIAGNOSIS — N840 Polyp of corpus uteri: Secondary | ICD-10-CM

## 2013-08-10 DIAGNOSIS — N926 Irregular menstruation, unspecified: Secondary | ICD-10-CM

## 2013-08-10 DIAGNOSIS — N938 Other specified abnormal uterine and vaginal bleeding: Secondary | ICD-10-CM

## 2013-08-10 DIAGNOSIS — N925 Other specified irregular menstruation: Secondary | ICD-10-CM

## 2013-08-10 DIAGNOSIS — N949 Unspecified condition associated with female genital organs and menstrual cycle: Secondary | ICD-10-CM

## 2013-08-10 NOTE — Progress Notes (Signed)
Ashley [name] 11-11-1970 [id number]        43 y.o.  G0P0 LMP 07/15/2013 presents for sonohysterogram due to history of large endometrial polyp and intermenstrual spotting. Still has some spotting in between her periods. Also in review of her chart I felt some nodularity in her right breast and ordered an ultrasound and mammogram. They described a simple cyst in her left breast 12:00 position of the areola and no abnormalities in the right breast.  Past medical history,surgical history, problem list, medications, allergies, family history and social history were all reviewed and documented in the EPIC chart.  Directed ROS with pertinent positives and negatives documented in the history of present illness/assessment and plan.  Exam: Kim assistant General appearance  Normal Breasts: Both breasts examined lying and sitting without definitive masses retractions discharge adenopathy. The area in the right breast previously palpated is gone. The left breast is dense but no differential tentative masses in the area of the described simple cyst.  Ultrasound shows uterus normal size and echotexture. Endometrial echo 6.3 mm. Right and left ovaries grossly normal. Cul-de-sac negative.  Sonohysterogram performed, sterile technique, easy catheter introduction, good distention with anterior 12 x 6 mm polyp. Endometrial sample taken. Patient tolerated well.  Assessment/Plan:  43 y.o. G0P0 with: 1. Simple cyst described in left breast. Previous right breast area resolved consistent with physiologic fibrocystic changes. Plan self breast exams monthly and reporting any palpated masses or abnormalities. Repeat mammogram is recommended in 1 year. 2. Small endometrial polyp. Endometrial biopsy taken and patient will followup these results. Recommend hysteroscopy D&C to resect this polyp and she agrees with this. I reviewed in general was involved with the procedure and she will return for a full preoperative  consult before hand. Patient's comfortable with this and will schedule the procedure.   Note: This document was prepared with digital dictation and possible smart phrase technology. Any transcriptional errors that result from this process are unintentional.   Dara LordsFONTAINE,Daeveon Zweber P MD, 3:22 PM 08/10/2013

## 2013-08-10 NOTE — Patient Instructions (Signed)
Office will call you to arrange the hysteroscopy D&C 

## 2013-08-13 ENCOUNTER — Telehealth: Payer: Self-pay

## 2013-08-13 ENCOUNTER — Other Ambulatory Visit: Payer: Self-pay | Admitting: Gynecology

## 2013-08-13 MED ORDER — MISOPROSTOL 200 MCG PO TABS: ORAL_TABLET | ORAL | Status: DC

## 2013-08-13 NOTE — Telephone Encounter (Signed)
Gas if I can palpate an area then I can try to aspirate that area. But if it looked like a simple cyst on ultrasound which she did and she is not having any issues as far as discomfort then there is no reason that it has to be aspirated.

## 2013-08-13 NOTE — Telephone Encounter (Addendum)
Informed patient [facility]  Pre Op consult with Dr. Velvet BatheF scheduled for 08/27/13.  Dr.TF [facility] while scheduling that you had told her you could do needle aspiration on a breast cyst. She asked if she could do that same time as pre op consult?

## 2013-08-14 ENCOUNTER — Telehealth: Payer: Self-pay

## 2013-08-14 NOTE — Telephone Encounter (Signed)
Whenever patient wants

## 2013-08-14 NOTE — Telephone Encounter (Signed)
Would this be something urgent so patient should be worked in for appt next week?

## 2013-08-14 NOTE — Telephone Encounter (Signed)
If patient can feel the area she could come in and I can try to aspirate it. If unsuccessful then that would eliminate the cyst and discomfort. If I cannot then I would go ahead with ultrasound to evaluate it at that point.

## 2013-08-14 NOTE — Telephone Encounter (Signed)
Patient said that area in breast has gotten larger and is causing her discomfort. She said she can feel it now. She is wondering if she should have another breast u/s.

## 2013-08-15 NOTE — Telephone Encounter (Signed)
Appt scheduled for Monday at 3:15pm. Patient informed.

## 2013-08-20 ENCOUNTER — Encounter (HOSPITAL_COMMUNITY): Payer: Self-pay | Admitting: Emergency Medicine

## 2013-08-20 ENCOUNTER — Ambulatory Visit: Payer: Self-pay | Admitting: Gynecology

## 2013-08-20 ENCOUNTER — Other Ambulatory Visit: Payer: Self-pay | Admitting: Gynecology

## 2013-08-20 ENCOUNTER — Emergency Department (HOSPITAL_COMMUNITY)
Admission: EM | Admit: 2013-08-20 | Discharge: 2013-08-20 | Disposition: A | Discharge status: Home / Self Care | Payer: Self-pay | Attending: Emergency Medicine | Admitting: Emergency Medicine

## 2013-08-20 DIAGNOSIS — N840 Polyp of corpus uteri: Secondary | ICD-10-CM

## 2013-08-20 DIAGNOSIS — Z8742 Personal history of other diseases of the female genital tract: Secondary | ICD-10-CM | POA: Insufficient documentation

## 2013-08-20 DIAGNOSIS — K219 Gastro-esophageal reflux disease without esophagitis: Secondary | ICD-10-CM | POA: Insufficient documentation

## 2013-08-20 DIAGNOSIS — Z79899 Other long term (current) drug therapy: Secondary | ICD-10-CM | POA: Insufficient documentation

## 2013-08-20 DIAGNOSIS — Z8659 Personal history of other mental and behavioral disorders: Secondary | ICD-10-CM | POA: Insufficient documentation

## 2013-08-20 DIAGNOSIS — Z791 Long term (current) use of non-steroidal anti-inflammatories (NSAID): Secondary | ICD-10-CM | POA: Insufficient documentation

## 2013-08-20 DIAGNOSIS — R079 Chest pain, unspecified: Secondary | ICD-10-CM | POA: Insufficient documentation

## 2013-08-20 DIAGNOSIS — Z8679 Personal history of other diseases of the circulatory system: Secondary | ICD-10-CM | POA: Insufficient documentation

## 2013-08-20 DIAGNOSIS — N926 Irregular menstruation, unspecified: Secondary | ICD-10-CM

## 2013-08-20 LAB — CBC
HEMATOCRIT: 38.1 % (ref 36.0–46.0)
HEMOGLOBIN: 13.1 g/dL (ref 12.0–15.0)
MCH: 31.3 pg (ref 26.0–34.0)
MCHC: 34.4 g/dL (ref 30.0–36.0)
MCV: 90.9 fL (ref 78.0–100.0)
Platelets: 213 10*3/uL (ref 150–400)
RBC: 4.19 MIL/uL (ref 3.87–5.11)
RDW: 11.8 % (ref 11.5–15.5)
WBC: 4.4 10*3/uL (ref 4.0–10.5)

## 2013-08-20 LAB — BASIC METABOLIC PANEL
BUN: 12 mg/dL (ref 6–23)
CHLORIDE: 93 meq/L — AB (ref 96–112)
CO2: 24 meq/L (ref 19–32)
Calcium: 9.3 mg/dL (ref 8.4–10.5)
Creatinine, Ser: 0.74 mg/dL (ref 0.50–1.10)
GFR calc Af Amer: >90 mL/min (ref >90)
Glucose, Bld: 137 mg/dL — ABNORMAL HIGH (ref 70–99)
Potassium: 3.8 mEq/L (ref 3.7–5.3)
SODIUM: 130 meq/L — AB (ref 137–147)

## 2013-08-20 LAB — I-STAT TROPONIN, ED: Troponin i, poc: 0 ng/mL (ref 0.00–0.08)

## 2013-08-20 MED ORDER — GI COCKTAIL ~~LOC~~
30.0000 mL | Freq: Once | ORAL | Status: AC
Start: 2013-08-20 — End: 2013-08-20
  Administered 2013-08-20: 30 mL via ORAL
  Filled 2013-08-20: qty 30

## 2013-08-20 MED ORDER — OMEPRAZOLE 20 MG PO CPDR: 20.0000 mg | DELAYED_RELEASE_CAPSULE | Freq: Every day | ORAL | Status: DC

## 2013-08-20 NOTE — ED Provider Notes (Signed)
CSN: [id number]     Arrival date & time 08/20/13  1424 History   First MD Initiated Contact with Patient 08/20/13 1818     Chief Complaint  Patient presents with  . Numbness      HPI Patient presents the emergency department because of left chest discomfort with numbness in her left arm.  She states it started sometime around 10 or 11AM and lasted for approximately 10 minutes and has nearly resolved although she still has some discomfort in her chest.  She denies trying any medications at home.  No family history of early heart disease.  She does not smoke cigarettes.  No history of hypertension, hyperlipidemia, and diabetes.  Her symptoms are significantly improved.   Past Medical History  Diagnosis Date  . Migraines   . Anxiety   . ASCUS favor benign 04/2013    Negative high risk HPV screen. Recommend repeat Pap smear/HPV one year  . Endometrial polyp 04/2013    Removed in the office extruding from the cervical os   Past Surgical History  Procedure Laterality Date  . Wisdom tooth extraction Bilateral    Family History  Problem Relation Age of Onset  . Bipolar disorder Mother   . Mental illness Mother   . Parkinsonism Father   . Hypertension Father   . Cancer Paternal Grandmother     Breast   History  Substance Use Topics  . Smoking status: Never Smoker   . Smokeless tobacco: Not on file  . Alcohol Use: No   OB History   Grav Para Term Preterm Abortions TAB SAB Ect Mult Living   0              Review of Systems  All other systems reviewed and are negative.     Allergies  Codeine  Home Medications   Prior to Admission medications   Medication Sig Start Date End Date Taking? Authorizing Janice Bodine  GARLIC PO Take 1 capsule by mouth daily.   Yes Historical Evaluna Utke, MD  Multiple Vitamins-Minerals (MULTIVITAMIN WITH MINERALS) tablet Take 1 tablet by mouth daily.   Yes Historical Spirit Wernli, MD  naproxen sodium (ANAPROX) 220 MG tablet Take 660 mg by mouth 2 (two)  times daily with a meal.   Yes Historical Aarushi Hemric, MD  omeprazole (PRILOSEC) 20 MG capsule Take 1 capsule (20 mg total) by mouth daily. 08/20/13   Lyanne CoKevin M Campos, MD   BP 118/69  Pulse 69  Temp(Src) 97.7 F (36.5 C) (Oral)  Resp 14  Ht 5\' 7"  (1.702 m)  Wt 125 lb (56.7 kg)  BMI 19.57 kg/m2  SpO2 100%  LMP 06/13/2013 Physical Exam  Nursing note and vitals reviewed. Constitutional: She is oriented to person, place, and time. She appears well-developed and well-nourished. No distress.  HENT:  Head: Normocephalic and atraumatic.  Eyes: EOM are normal.  Neck: Normal range of motion.  Cardiovascular: Normal rate, regular rhythm and normal heart sounds.   Pulmonary/Chest: Effort normal and breath sounds normal.  Abdominal: Soft. She exhibits no distension. There is no tenderness.  Musculoskeletal: Normal range of motion.  Neurological: She is alert and oriented to person, place, and time.  Skin: Skin is warm and dry.  Psychiatric: Judgment normal.  Anxious appearing    ED Course  Procedures (including critical care time) Labs Review Labs Reviewed  BASIC METABOLIC PANEL - Abnormal; Notable for the following:    Sodium 130 (*)    Chloride 93 (*)    Glucose, Bld 137 (*)  All other components within normal limits  CBC  I-STAT TROPOININ, ED    Imaging Review No results found.   EKG Interpretation   Date/Time:  Monday August 20 2013 14:34:48 EDT Ventricular Rate:  91 PR Interval:  146 QRS Duration: 101 QT Interval:  364 QTC Calculation: 448 R Axis:   106 Text Interpretation:  Sinus rhythm Biatrial enlargement Right axis  deviation No old tracing to compare Confirmed by CAMPOS  MD, Caryn BeeKEVIN ([id number])  on 08/20/2013 8:22:39 PM      MDM   Final diagnoses:  Chest pain, unspecified chest pain type  Gastroesophageal reflux disease without esophagitis    Low suspicion for cardiac disease.  EKG and troponin normal.  Patient feels better after GI cocktail.  Home on Prilosec.   Suspect gastroesophageal reflux disease.  Patient is PERC negative    Lyanne CoKevin M Campos, MD 08/20/13 2025

## 2013-08-20 NOTE — ED Notes (Signed)
Pt presents with c/o left arm numbness and tingling that started around 1:30 today. Pt believes that she may have been having a panic attack.

## 2013-08-20 NOTE — Discharge Instructions (Signed)

## 2013-08-20 NOTE — ED Notes (Signed)
Pt c/o left arm numbness that radiates up arm to chest. C/o pressure to chest. States it started mid morning. Pt has face symmetrical.Pt just having hard time explaining what happened.

## 2013-08-20 NOTE — ED Notes (Signed)
Patient is alert and oriented x3.  She was given DC instructions and follow up visit instructions.  Patient gave verbal understanding. She was DC ambulatory under her own power to home.  V/S stable.  He was not showing any signs of distress on DC 

## 2013-08-22 ENCOUNTER — Telehealth: Payer: Self-pay

## 2013-08-22 NOTE — Telephone Encounter (Signed)
Patient requested surgery date change as she does not feel she can be NPO after MN until 1:00pm.  [facility]  Patient informed.

## 2013-08-27 ENCOUNTER — Encounter: Payer: Self-pay | Admitting: Gynecology

## 2013-08-27 ENCOUNTER — Ambulatory Visit (INDEPENDENT_AMBULATORY_CARE_PROVIDER_SITE_OTHER): Payer: Self-pay | Admitting: Gynecology

## 2013-08-27 DIAGNOSIS — N840 Polyp of corpus uteri: Secondary | ICD-10-CM

## 2013-08-27 DIAGNOSIS — N6009 Solitary cyst of unspecified breast: Secondary | ICD-10-CM

## 2013-08-27 DIAGNOSIS — N6002 Solitary cyst of left breast: Secondary | ICD-10-CM

## 2013-08-27 NOTE — H&P (Signed)
  Ashley HeckKatherine S [name] Aug 22, 1970 [id number]   History and Physical  Chief complaint: Endometrial polyp, irregular bleeding  History of present illness: 43 y.o. G0P0 with history of irregular spotting with exam showing what was thought to be a large endocervical polyp but on removal turned out to be an endometrial polyp and pathology February 2015.  Her spotting continued and she subsequently had a sonohysterogram 07/2013 which showed a 12 x 6 mm anterior polyp. Right and left ovaries were normal. Cul-de-sac negative. Endometrial sample showed atrophic-appearing endometrium. Patient's admitted at this time for hysteroscopy D&C with resection of her endometrial polyp.  Past medical history,surgical history, medications, allergies, family history and social history were all reviewed and documented in the EPIC chart. ROS:  Was performed and pertinent positives and negatives are included in the history of present illness.  Exam:  Kim assistant General: well developed, well nourished female, no acute distress HEENT: normal  Lungs: clear to auscultation without wheezing, rales or rhonchi  Cardiac: regular rate without rubs, murmurs or gallops  Abdomen: soft, nontender without masses, guarding, rebound, organomegaly  Pelvic: external bus vagina: normal   Cervix: grossly normal  Uterus: normal size, midline and mobile, nontender  Adnexa: without masses or tenderness    Assessment/Plan:  43 y.o. G0P0 with history outlined above for hysteroscopy D&C.   I reviewed the proposed surgery with her to include the expected intraoperative/postoperative courses and recovery period. Risks to include infection, prolonged antibiotics, hemorrhage necessitating transfusion and the risks of transfusion to include transfusion reaction, hepatitis, HIV, mad cow disease and other unknown entities. The risk of uterine perforation with damage to internal organs including bowel, bladder, ureters, vessels, nerves either  immediately recognized or delay recognized, necessitating major repair at surgeries and future repair of surgeries including bowel resection, ostomy formation, bladder repair and ureteral damage repair was also discussed with her. Distended media absorption leading to complications such as fluid overload, coma, seizures was also discussed. No guarantees to she will not develop polyps in the future reviewed. I reviewed the need for septectomy before and how to use it.  The patient's questions were answered to her satisfaction and she is ready to proceed with surgery.  Note: This document was prepared with digital dictation and possible smart phrase technology. Any transcriptional errors that result from this process are unintentional.  Dara LordsFONTAINE,TIMOTHY P MD, 4:42 PM 08/27/2013

## 2013-08-27 NOTE — Progress Notes (Signed)
Ashley [name] 12-19-1970 [id number]        43 y.o.  G0P0 for preoperative consult for upcoming hysteroscopy D&C. Also was noted to have a right breast mass on exam. On followup mammography right breast was normal the left breast showed an area that on subsequent ultrasound was a simple cyst in the periareolar area. Followup breast exam showed no persistent mass on the right and dense breast on the left but no definitive masses.  Past medical history,surgical history, problem list, medications, allergies, family history and social history were all reviewed and documented as reviewed in the EPIC chart.   Exam: Kim assistant Skin: Grossly normal HEENT: Without gross lesions.  No cervical or supraclavicular adenopathy. Thyroid normal.  Lungs:  Clear without wheezing, rales or rhonchi Cardiac: RR, without RMG Abdominal:  Soft, nontender, without masses, guarding, rebound, organomegaly or hernia Breasts:  Examined lying and sitting without masses, retractions, discharge or axillary adenopathy. Pelvic:  Ext/BUS/vagina normal  Cervix normal  Uterus anteverted, normal size, shape and contour, midline and mobile nontender   Adnexa  Without masses or tenderness    Anus and perineum  Normal    Assessment/Plan:  43 y.o. G0P0 female with 2 issues:  1. Left breast cyst. Patient asked about would reexamine her breast and she felt that the area on the left was getting larger. Exam of both breasts lying and sitting are normal. She does have dense breasts particularly on the left but no clear palpable abnormalities. Recent mammogram and ultrasound shows a small simple cyst. Recommend continue self breast exams as long as her exam appears stable none to follow. It does appear to be changing them will restudy her and she'll call me. If things remain stable then we'll plan on repeat studies in one year. 2. Sonohysterogram with endometrial polyp. Patient for scheduled hysteroscopy D&C and resection. Initially  was referred due to intermenstrual bleeding and on exam was found to have a large thought to be cervical polyp but on pathology was an endometrial polyp. This led to the sonohysterogram and now the hysteroscopy D&C. I reviewed the proposed surgery with her to include the expected intraoperative/postoperative courses and recovery period. Risks to include infection, prolonged antibiotics, hemorrhage necessitating transfusion and the risks of transfusion to include transfusion reaction, hepatitis, HIV, mad cow disease and other unknown entities. The risk of uterine perforation with damage to internal organs including bowel, bladder, ureters, vessels, nerves either immediately recognized or delay recognized, necessitating major repair at surgeries and future repair of surgeries including bowel resection, ostomy formation, bladder repair and ureteral damage repair was also discussed with her. Distended media absorption leading to complications such as fluid overload, coma, seizures was also discussed. No guarantees to she will not develop polyps in the future reviewed. I reviewed the need for septectomy before and how to use it.  The patient's questions were answered to her satisfaction and she is ready to proceed with surgery.   Note: This document was prepared with digital dictation and possible smart phrase technology. Any transcriptional errors that result from this process are unintentional.   Dara LordsFONTAINE,TIMOTHY P MD, 4:33 PM 08/27/2013

## 2013-08-27 NOTE — Patient Instructions (Signed)
Continue self breast exams. Report any changes in the nodularity you feel in your breasts. Followup for surgery as scheduled.

## 2013-09-04 ENCOUNTER — Encounter (HOSPITAL_COMMUNITY): Payer: Self-pay | Admitting: Pharmacist

## 2013-09-11 ENCOUNTER — Encounter (HOSPITAL_COMMUNITY): Payer: Self-pay

## 2013-09-11 ENCOUNTER — Encounter (HOSPITAL_COMMUNITY)
Admission: RE | Admit: 2013-09-11 | Discharge: 2013-09-11 | Disposition: A | Discharge status: Home / Self Care | Payer: Self-pay | Source: Ambulatory Visit | Attending: Gynecology | Admitting: Gynecology

## 2013-09-11 DIAGNOSIS — Z01812 Encounter for preprocedural laboratory examination: Secondary | ICD-10-CM | POA: Insufficient documentation

## 2013-09-11 DIAGNOSIS — Z01818 Encounter for other preprocedural examination: Secondary | ICD-10-CM | POA: Insufficient documentation

## 2013-09-11 NOTE — Patient Instructions (Signed)
20 Ashley [name]  09/11/2013   Your procedure is scheduled on:  09/18/13  Enter through the Main Entr[facility] of [facility] at 6 AM.  Pick up the phone at the desk and dial [phone].   Call this number if you have problems the morning of surgery: [phone]   Remember:   Do not eat food:After Midnight.  Do not drink clear liquids: After Midnight.  Take these medicines the morning of surgery with A SIP OF WATER: NA   Do not wear jewelry, make-up or nail polish.  Do not wear lotions, powders, or perfumes. You may wear deodorant.  Do not shave 48 hours prior to surgery.  Do not bring valuables to the hospital.  [facility] is not   responsible for any belongings or valuables brought to the hospital.  Contacts, dentures or bridgework may not be worn into surgery.  Leave suitcase in the car. After surgery it may be brought to your room.  For patients admitted to the hospital, checkout time is 11:00 AM the day of              discharge.   Patients discharged the day of surgery will not be allowed to drive             home.  Name and phone number of your driver: NA  Special Instructions:      Please read over the following fact sheets that you were given:   Surgical Site Infection Prevention

## 2013-09-12 LAB — CBC
HEMATOCRIT: 38.3 % (ref 36.0–46.0)
HEMOGLOBIN: 12.6 g/dL (ref 12.0–15.0)
MCH: 31.3 pg (ref 26.0–34.0)
MCHC: 32.9 g/dL (ref 30.0–36.0)
MCV: 95.3 fL (ref 78.0–100.0)
Platelets: 195 10*3/uL (ref 150–400)
RBC: 4.02 MIL/uL (ref 3.87–5.11)
RDW: 12.7 % (ref 11.5–15.5)
WBC: 5.5 10*3/uL (ref 4.0–10.5)

## 2013-09-17 MED ORDER — DEXTROSE 5 % IV SOLN
2.0000 g | INTRAVENOUS | Status: AC
  Administered 2013-09-18: 2 g via INTRAVENOUS
  Filled 2013-09-17: qty 2

## 2013-09-18 ENCOUNTER — Encounter (HOSPITAL_COMMUNITY)
Admission: RE | Disposition: A | Discharge status: Home / Self Care | Payer: Self-pay | Source: Ambulatory Visit | Attending: Gynecology

## 2013-09-18 ENCOUNTER — Ambulatory Visit (HOSPITAL_COMMUNITY)
Admission: RE | Admit: 2013-09-18 | Discharge: 2013-09-18 | Disposition: A | Discharge status: Home / Self Care | Payer: Self-pay | Source: Ambulatory Visit | Attending: Gynecology | Admitting: Gynecology

## 2013-09-18 ENCOUNTER — Encounter (HOSPITAL_COMMUNITY): Payer: Self-pay | Admitting: *Deleted

## 2013-09-18 ENCOUNTER — Encounter (HOSPITAL_COMMUNITY): Payer: Self-pay | Admitting: Anesthesiology

## 2013-09-18 ENCOUNTER — Ambulatory Visit (HOSPITAL_COMMUNITY): Payer: Self-pay | Admitting: Anesthesiology

## 2013-09-18 DIAGNOSIS — N840 Polyp of corpus uteri: Secondary | ICD-10-CM | POA: Insufficient documentation

## 2013-09-18 DIAGNOSIS — N926 Irregular menstruation, unspecified: Secondary | ICD-10-CM | POA: Insufficient documentation

## 2013-09-18 DIAGNOSIS — N915 Oligomenorrhea, unspecified: Secondary | ICD-10-CM

## 2013-09-18 DIAGNOSIS — K219 Gastro-esophageal reflux disease without esophagitis: Secondary | ICD-10-CM | POA: Insufficient documentation

## 2013-09-18 HISTORY — PX: DILATATION & CURETTAGE/HYSTEROSCOPY WITH TRUECLEAR: SHX6353

## 2013-09-18 LAB — HCG, SERUM, QUALITATIVE: Preg, Serum: NEGATIVE

## 2013-09-18 SURGERY — DILATATION & CURETTAGE/HYSTEROSCOPY WITH TRUCLEAR
Anesthesia: General | Site: Vagina

## 2013-09-18 MED ORDER — KETOROLAC TROMETHAMINE 30 MG/ML IJ SOLN: 15.0000 mg | Freq: Once | INTRAMUSCULAR | Status: DC | PRN

## 2013-09-18 MED ORDER — DEXAMETHASONE SODIUM PHOSPHATE 10 MG/ML IJ SOLN
INTRAMUSCULAR | Status: AC
  Filled 2013-09-18: qty 1

## 2013-09-18 MED ORDER — LIDOCAINE HCL (CARDIAC) 20 MG/ML IV SOLN
INTRAVENOUS | Status: AC
  Filled 2013-09-18: qty 5

## 2013-09-18 MED ORDER — PROPOFOL 10 MG/ML IV BOLUS
INTRAVENOUS | Status: DC | PRN
  Administered 2013-09-18: 150 mg via INTRAVENOUS

## 2013-09-18 MED ORDER — FENTANYL CITRATE 0.05 MG/ML IJ SOLN
INTRAMUSCULAR | Status: AC
  Filled 2013-09-18: qty 2

## 2013-09-18 MED ORDER — PROMETHAZINE HCL 25 MG/ML IJ SOLN
INTRAMUSCULAR | Status: AC
  Administered 2013-09-18: 6.25 mg via INTRAVENOUS
  Filled 2013-09-18: qty 1

## 2013-09-18 MED ORDER — ONDANSETRON HCL 4 MG/2ML IJ SOLN
INTRAMUSCULAR | Status: DC | PRN
  Administered 2013-09-18: 4 mg via INTRAVENOUS

## 2013-09-18 MED ORDER — LACTATED RINGERS IV SOLN
INTRAVENOUS | Status: DC
  Administered 2013-09-18 (×2): via INTRAVENOUS

## 2013-09-18 MED ORDER — GLYCOPYRROLATE 0.2 MG/ML IJ SOLN
INTRAMUSCULAR | Status: DC | PRN
  Administered 2013-09-18: 0.2 mg via INTRAVENOUS

## 2013-09-18 MED ORDER — EPHEDRINE SULFATE 50 MG/ML IJ SOLN
INTRAMUSCULAR | Status: AC
  Administered 2013-09-18: 5 mg
  Filled 2013-09-18: qty 1

## 2013-09-18 MED ORDER — LIDOCAINE HCL 1 % IJ SOLN
INTRAMUSCULAR | Status: AC
  Filled 2013-09-18: qty 20

## 2013-09-18 MED ORDER — KETOROLAC TROMETHAMINE 30 MG/ML IJ SOLN
INTRAMUSCULAR | Status: AC
  Filled 2013-09-18: qty 1

## 2013-09-18 MED ORDER — SODIUM CHLORIDE 0.9 % IR SOLN
Status: DC | PRN
  Administered 2013-09-18: 3000 mL

## 2013-09-18 MED ORDER — ONDANSETRON HCL 4 MG/2ML IJ SOLN
INTRAMUSCULAR | Status: AC
  Filled 2013-09-18: qty 2

## 2013-09-18 MED ORDER — MIDAZOLAM HCL 2 MG/2ML IJ SOLN
INTRAMUSCULAR | Status: AC
  Filled 2013-09-18: qty 2

## 2013-09-18 MED ORDER — PANTOPRAZOLE SODIUM 40 MG PO TBEC
40.0000 mg | DELAYED_RELEASE_TABLET | Freq: Once | ORAL | Status: AC
  Administered 2013-09-18: 40 mg via ORAL

## 2013-09-18 MED ORDER — PROMETHAZINE HCL 25 MG/ML IJ SOLN
6.2500 mg | INTRAMUSCULAR | Status: DC | PRN
  Administered 2013-09-18: 6.25 mg via INTRAVENOUS

## 2013-09-18 MED ORDER — LIDOCAINE HCL (CARDIAC) 20 MG/ML IV SOLN
INTRAVENOUS | Status: DC | PRN
  Administered 2013-09-18: 50 mg via INTRAVENOUS

## 2013-09-18 MED ORDER — PANTOPRAZOLE SODIUM 40 MG PO TBEC
DELAYED_RELEASE_TABLET | ORAL | Status: AC
  Filled 2013-09-18: qty 1

## 2013-09-18 MED ORDER — LIDOCAINE HCL 1 % IJ SOLN
INTRAMUSCULAR | Status: DC | PRN
  Administered 2013-09-18: 10 mL

## 2013-09-18 MED ORDER — GLYCOPYRROLATE 0.2 MG/ML IJ SOLN
INTRAMUSCULAR | Status: AC
  Filled 2013-09-18: qty 1

## 2013-09-18 MED ORDER — MEPERIDINE HCL 25 MG/ML IJ SOLN: 6.2500 mg | INTRAMUSCULAR | Status: DC | PRN

## 2013-09-18 MED ORDER — ONDANSETRON HCL 4 MG/2ML IJ SOLN
4.0000 mg | Freq: Once | INTRAMUSCULAR | Status: AC | PRN
  Administered 2013-09-18: 4 mg via INTRAVENOUS

## 2013-09-18 MED ORDER — DEXAMETHASONE SODIUM PHOSPHATE 4 MG/ML IJ SOLN
INTRAMUSCULAR | Status: AC
  Filled 2013-09-18: qty 1

## 2013-09-18 MED ORDER — OXYCODONE-ACETAMINOPHEN 2.5-325 MG PO TABS: 1.0000 | ORAL_TABLET | ORAL | Status: DC | PRN

## 2013-09-18 MED ORDER — FENTANYL CITRATE 0.05 MG/ML IJ SOLN
25.0000 ug | INTRAMUSCULAR | Status: DC | PRN
  Administered 2013-09-18: 50 ug via INTRAVENOUS

## 2013-09-18 MED ORDER — DEXAMETHASONE SODIUM PHOSPHATE 10 MG/ML IJ SOLN
INTRAMUSCULAR | Status: DC | PRN
Start: 2013-09-18 — End: 2013-09-18
  Administered 2013-09-18: 10 mg via INTRAVENOUS

## 2013-09-18 MED ORDER — EPHEDRINE 5 MG/ML INJ: 10.0000 mg | Freq: Once | INTRAVENOUS | Status: DC

## 2013-09-18 MED ORDER — FENTANYL CITRATE 0.05 MG/ML IJ SOLN
INTRAMUSCULAR | Status: DC | PRN
  Administered 2013-09-18: 50 ug via INTRAVENOUS
  Administered 2013-09-18 (×2): 25 ug via INTRAVENOUS

## 2013-09-18 MED ORDER — KETOROLAC TROMETHAMINE 30 MG/ML IJ SOLN
INTRAMUSCULAR | Status: DC | PRN
  Administered 2013-09-18: 30 mg via INTRAVENOUS

## 2013-09-18 MED ORDER — MIDAZOLAM HCL 2 MG/2ML IJ SOLN
INTRAMUSCULAR | Status: DC | PRN
  Administered 2013-09-18: 2 mg via INTRAVENOUS

## 2013-09-18 SURGICAL SUPPLY — 15 items
BLADE INCISOR TRUC PLUS 2.9 (ABLATOR) IMPLANT
CANISTERS HI-FLOW 3000CC (CANNISTER) ×2 IMPLANT
CATH ROBINSON RED A/P 16FR (CATHETERS) ×2 IMPLANT
CLOTH BEACON ORANGE TIMEOUT ST (SAFETY) ×2 IMPLANT
CONTAINER PREFILL 10% NBF 60ML (FORM) ×4 IMPLANT
DRAPE HYSTEROSCOPY (DRAPE) ×2 IMPLANT
GLOVE BIO SURGEON STRL SZ7.5 (GLOVE) ×2 IMPLANT
GOWN STRL REUS W/TWL LRG LVL3 (GOWN DISPOSABLE) ×4 IMPLANT
INCISOR TRUC PLUS BLADE 2.9 (ABLATOR) ×2
KIT HYSTEROSCOPY TRUCLEAR (ABLATOR) ×1 IMPLANT
MORCELLATOR RECIP TRUCLEAR 4.0 (ABLATOR) IMPLANT
PACK VAGINAL MINOR WOMEN LF (CUSTOM PROCEDURE TRAY) ×2 IMPLANT
PAD OB MATERNITY 4.3X12.25 (PERSONAL CARE ITEMS) ×2 IMPLANT
TOWEL OR 17X24 6PK STRL BLUE (TOWEL DISPOSABLE) ×4 IMPLANT
WATER STERILE IRR 1000ML POUR (IV SOLUTION) ×2 IMPLANT

## 2013-09-18 NOTE — Anesthesia Preprocedure Evaluation (Signed)
Anesthesia Evaluation  Patient identified by MRN, date of birth, ID band Patient awake    Reviewed: Allergy & Precautions, H&P , NPO status , Patient's Chart, lab work & pertinent test results, reviewed documented beta blocker date and time   Airway Mallampati: I TM Distance: >3 FB Neck ROM: full    Dental no notable dental hx. (+) Teeth Intact   Pulmonary neg pulmonary ROS,          Cardiovascular negative cardio ROS      Neuro/Psych negative neurological ROS  negative psych ROS   GI/Hepatic Neg liver ROS, GERD-  Medicated and Controlled,  Endo/Other  negative endocrine ROS  Renal/GU negative Renal ROS     Musculoskeletal   Abdominal Normal abdominal exam  (+)   Peds  Hematology negative hematology ROS (+)   Anesthesia Other Findings   Reproductive/Obstetrics negative OB ROS                           Anesthesia Physical Anesthesia Plan  ASA: II  Anesthesia Plan: General   Post-op Pain Management:    Induction: Intravenous  Airway Management Planned: LMA  Additional Equipment:   Intra-op Plan:   Post-operative Plan:   Informed Consent: I have reviewed the patients History and Physical, chart, labs and discussed the procedure including the risks, benefits and alternatives for the proposed anesthesia with the patient or authorized representative who has indicated his/her understanding and acceptance.     Plan Discussed with: CRNA and Surgeon  Anesthesia Plan Comments:         Anesthesia Quick Evaluation

## 2013-09-18 NOTE — Discharge Instructions (Signed)
° °Postoperative Instructions Hysteroscopy D & C ° °Dr. Fontaine and the nursing staff have discussed postoperative instructions with you.  If you have any questions please ask them before you leave the hospital, or call Dr Fontaine’s office at [phone].   ° °We would like to emphasize the following instructions: ° ° °? Call the office to make your follow-up appointment as recommended by Dr Fontaine (usually 1-2 weeks). ° °? You were given a prescription, or one was ordered for you at the pharmacy you designated.  Get that prescription filled and take the medication according to instructions. ° °? You may eat a regular diet, but slowly until you start having bowel movements. ° °? Drink plenty of water daily. ° °? Nothing in the vagina (intercourse, douching, objects of any kind) for two weeks.  When reinitiating intercourse, if it is uncomfortable, stop and make an appointment with Dr Fontaine to be evaluated. ° °? No driving for one to two days until the effects of anesthesia has worn off.  No traveling out of town for several days. ° °? You may shower, but no baths for one week.  Walking up and down stairs is ok.  No heavy lifting, prolonged standing, repeated bending or any “working out” until your post op check. ° °? Rest frequently, listen to your body and do not push yourself and overdo it. ° °? Call if: ° °o Your pain medication does not seem strong enough. °o Worsening pain or abdominal bloating °o Persistent nausea or vomiting °o Difficulty with urination or bowel movements. °o Temperature of 101 degrees or higher. °o Heavy vaginal bleeding.  If your period is due, you may use tampons. °o You have any questions or concerns ° ° °DISCHARGE INSTRUCTIONS: D&C / D&E °The following instructions have been prepared to help you care for yourself upon your return home. °  °Personal hygiene: °• Use sanitary pads for vaginal drainage, not tampons. °• Shower the day after your procedure. °• NO tub baths, pools or  Jacuzzis for 2-3 weeks. °• Wipe front to back after using the bathroom. ° °Activity and limitations: °• Do NOT drive or operate any equipment for 24 hours. The effects of anesthesia are still present and drowsiness may result. °• Do NOT rest in bed all day. °• Walking is encouraged. °• Walk up and down stairs slowly. °• You may resume your normal activity in one to two days or as indicated by your physician. ° °Sexual activity: NO intercourse for at least 2 weeks after the procedure, or as indicated by your physician. ° °Diet: Eat a light meal as desired this evening. You may resume your usual diet tomorrow. ° °Return to work: You may resume your work activities in one to two days or as indicated by your doctor. ° °What to expect after your surgery: Expect to have vaginal bleeding/discharge for 2-3 days and spotting for up to 10 days. It is not unusual to have soreness for up to 1-2 weeks. You may have a slight burning sensation when you urinate for the first day. Mild cramps may continue for a couple of days. You may have a regular period in 2-6 weeks. ° °Call your doctor for any of the following: °• Excessive vaginal bleeding, saturating and changing one pad every hour. °• Inability to urinate 6 hours after discharge from hospital. °• Pain not relieved by pain medication. °• Fever of 100.4° F or greater. °• Unusual vaginal discharge or odor. ° ° Call for an   appointment:  ° ° °Patient’s signature: ______________________ ° °Nurse’s signature ________________________ ° °Support person's signature_______________________ ° ° ° °

## 2013-09-18 NOTE — Transfer of Care (Signed)
Immediate Anesthesia Transfer of Care Note  Patient: Ashley [name]  Procedure(s) Performed: Procedure(s): DILATATION & CURETTAGE/HYSTEROSCOPY WITH TRUCLEAR (N/A)  Patient Location: PACU  Anesthesia Type:General  Level of Consciousness: awake, alert  and oriented  Airway & Oxygen Therapy: Patient Spontanous Breathing and Patient connected to nasal cannula oxygen  Post-op Assessment: Report given to PACU RN, Post -op Vital signs reviewed and stable and Patient moving all extremities X 4  Post vital signs: Reviewed and stable  Complications: No apparent anesthesia complications

## 2013-09-18 NOTE — Anesthesia Postprocedure Evaluation (Signed)
Anesthesia Post Note  Patient: Ashley HeckKatherine S Laufer  Procedure(s) Performed: Procedure(s) (LRB): DILATATION & CURETTAGE/HYSTEROSCOPY WITH TRUCLEAR (N/A)  Anesthesia type: General  Patient location: PACU  Post pain: Pain level controlled  Post assessment: Post-op Vital signs reviewed  Last Vitals:  Filed Vitals:   09/18/13 1040  BP: 110/58  Pulse:   Temp:   Resp:     Post vital signs: Reviewed  Level of consciousness: sedated  Complications: No apparent anesthesia complications

## 2013-09-18 NOTE — H&P (Signed)
  The patient was examined.  I reviewed the proposed surgery and consent form with the patient.  The dictated history and physical is current and accurate and all questions were answered. The patient is ready to proceed with surgery and has a realistic understanding and expectation for the outcome.   Dara LordsFONTAINE,Jalynn Betzold P MD, 7:11 AM 09/18/2013

## 2013-09-18 NOTE — Op Note (Signed)
Ashley [name] 16-Sep-1970 [id number]   Post Operative Note   Date of surgery:  09/18/2013  Pre Op Dx:  Irregular bleeding, endometrial polyp  Post Op Dx:  Irregular bleeding, endometrial polyp  Procedure:  Hysteroscopy, D&C, Truclear resection endometrial polyp  Surgeon:  Dara LordsFONTAINE,Ragina Fenter P  Anesthesia:  General  EBL:  Minimal  Distended media discrepancy:  20 cc saline  Complications:  None  Specimen:  #1 endometrial polyp fragments #2 endometrial curetting to pathology  Findings: EUA:  External BUS vagina normal. Cervix normal. Uterus normal size anteverted midline mobile. Adnexa without masses   Hysteroscopic: Adequate with fundus, anterior/posterior endometrial surfaces, lower uterine segment, endocervical canal, right/left tubal ostia all visualized. Endometrial polyp anterior left endometrial surface. Remainder of the hysteroscopy was normal. Endometrial surface atrophic in appearance with scant return on endometrial curetting  Procedure:  The patient was taken to the operating room, underwent general anesthesia, was placed in the low dorsal lithotomy position, received a perineal/vaginal preparation with Betadine solution, the bladder emptied with an in and out Foley catheterization and an EUA was performed. The time out was performed by the surgical team. The patient was draped in the usual fashion. The cervix was visualized with a speculum, anterior lip grasped with a single-tooth tenaculum and a paracervical block using 1% lidocaine was placed, 10 cc total. The cervix was gently and gradually dilated to admit the Truclear 5 mm hysteroscope and hysteroscopy was performed with findings noted above. Using the small incisor Truclear resector the polyp was then resected in its entirety to the level of the surrounding endometrium. A gentle sharp curettage was then performed and repeat hysteroscopy showed an empty cavity with good distention and no evidence of perforation. The  tenaculum was removed and adequate hemostasis was visualized at the tenaculum site and external cervical os. The speculum was removed, the patient placed in the supine position, received intraoperative Toradol, was awakened without difficulty and taken to the recovery room in good condition having tolerated the procedure well.   Note: This document was prepared with digital dictation and possible smart phrase technology. Any transcriptional errors that result from this process are unintentional.  Dara LordsFONTAINE,Elnor Renovato P MD, 8:40 AM 09/18/2013

## 2013-09-19 ENCOUNTER — Encounter (HOSPITAL_COMMUNITY): Payer: Self-pay | Admitting: Gynecology

## 2013-09-21 ENCOUNTER — Ambulatory Visit (INDEPENDENT_AMBULATORY_CARE_PROVIDER_SITE_OTHER): Payer: Self-pay | Admitting: Gynecology

## 2013-09-21 ENCOUNTER — Encounter: Payer: Self-pay | Admitting: Gynecology

## 2013-09-21 DIAGNOSIS — Z9889 Other specified postprocedural states: Secondary | ICD-10-CM

## 2013-09-21 NOTE — Progress Notes (Signed)
Ashley [name] 1970/07/14 [id number]        43 y.o.  G0P0 presents having had hysteroscopy D&C 3 days ago stating this morning she was wearing a pad and thought she saw some stool on the pad that came from her vagina. Describes it as greenish in appearance, scant amount with no odor. Otherwise doing well without fever or chills. Eating without difficulty voiding and having bowel movements. No abdominal pain or other problems. No vaginal bleeding. Pathology from the hysteroscopy D&C showed a benign endometrial polyp. The procedure was uncomplicated noting at the end of the procedure hysteroscopy showed good distention and no evidence of perforation.  Past medical history,surgical history, problem list, medications, allergies, family history and social history were all reviewed and documented in the EPIC chart.  Directed ROS with pertinent positives and negatives documented in the history of present illness/assessment and plan.  Exam: Sherrilyn RistKari assistant General appearance:  Normal HEENT normal Lungs clear Cardiac regular rate no rubs murmurs or gallops Abdomen with active bowel sounds, soft nontender without masses guarding rebound Pelvic external BUS vagina with some dried stool in the perianal region. Vagina clean without evidence of stool blood or discharge. Cervix normal. Uterus normal size midline mobile nontender. Adnexa without masses or tenderness. Rectal exam normal   Assessment/Plan:  43 y.o. G0P0 status post hysteroscopy D&C 3 days ago with reported episode of greenish tinged area on her pad she was worried that it was stool. No other symptoms. Exam is negative without evidence of stool in the vagina or any pain on abdominal/pelvic exam. Reassured patient that I suspect that this was either residual Betadine or discharge from her procedure or cross contamination from her rectum misinterpreted as coming from her vagina. No evidence of perforation or fistula. ASAP call precautions were  reviewed with her to include developing pain fevers nausea vomiting. Otherwise patient will followup routinely. I reviewed the results of surgery with her to include pathology and pictures from surgery.   Note: This document was prepared with digital dictation and possible smart phrase technology. Any transcriptional errors that result from this process are unintentional.   Dara LordsFONTAINE,Raelyn Racette P MD, 10:05 AM 09/21/2013

## 2013-09-21 NOTE — Patient Instructions (Addendum)
Followup if your symptoms continue otherwise followup routinely. Call if you develop any pain, fevers, nausea or vomiting or any other symptoms.

## 2013-09-23 ENCOUNTER — Telehealth: Payer: Self-pay | Admitting: Gynecology

## 2013-09-23 NOTE — Telephone Encounter (Signed)
On Call Note 817 AM:  Complaining of diarrhea since surgery.  No fevers, chills, N/V, eating, drinking, no pain.  Recommend OTC antidiarrheal, push fluids.  Discussed antibiotic induced diarrhea possibility although unlikely after only one dose of antibiotic and seem to start right after the surgery.  Call/OV if continues.

## 2013-12-14 ENCOUNTER — Other Ambulatory Visit: Payer: Self-pay

## 2014-04-29 ENCOUNTER — Emergency Department (INDEPENDENT_AMBULATORY_CARE_PROVIDER_SITE_OTHER)
Admission: EM | Admit: 2014-04-29 | Discharge: 2014-04-29 | Disposition: A | Discharge status: Home / Self Care | Payer: Self-pay | Source: Home / Self Care | Attending: Family Medicine | Admitting: Family Medicine

## 2014-04-29 ENCOUNTER — Encounter (HOSPITAL_COMMUNITY): Payer: Self-pay

## 2014-04-29 DIAGNOSIS — J069 Acute upper respiratory infection, unspecified: Secondary | ICD-10-CM

## 2014-04-29 LAB — POCT RAPID STREP A: STREPTOCOCCUS, GROUP A SCREEN (DIRECT): NEGATIVE

## 2014-04-29 NOTE — Discharge Instructions (Signed)
Drink plenty of water   Try throat lozenges as needed.    Upper Respiratory Infection, Adult An upper respiratory infection (URI) is also sometimes known as the common cold. The upper respiratory tract includes the nose, sinuses, throat, trachea, and bronchi. Bronchi are the airways leading to the lungs. Most people improve within 1 week, but symptoms can last up to 2 weeks. A residual cough may last even longer.  CAUSES Many different viruses can infect the tissues lining the upper respiratory tract. The tissues become irritated and inflamed and often become very moist. Mucus production is also common. A cold is contagious. You can easily spread the virus to others by oral contact. This includes kissing, sharing a glass, coughing, or sneezing. Touching your mouth or nose and then touching a surface, which is then touched by another person, can also spread the virus. SYMPTOMS  Symptoms typically develop 1 to 3 days after you come in contact with a cold virus. Symptoms vary from person to person. They may include:  Runny nose.  Sneezing.  Nasal congestion.  Sinus irritation.  Sore throat.  Loss of voice (laryngitis).  Cough.  Fatigue.  Muscle aches.  Loss of appetite.  Headache.  Low-grade fever. DIAGNOSIS  You might diagnose your own cold based on familiar symptoms, since most people get a cold 2 to 3 times a year. Your caregiver can confirm this based on your exam. Most importantly, your caregiver can check that your symptoms are not due to another disease such as strep throat, sinusitis, pneumonia, asthma, or epiglottitis. Blood tests, throat tests, and X-rays are not necessary to diagnose a common cold, but they may sometimes be helpful in excluding other more serious diseases. Your caregiver will decide if any further tests are required. RISKS AND COMPLICATIONS  You may be at risk for a more severe case of the common cold if you smoke cigarettes, have chronic heart disease  (such as heart failure) or lung disease (such as asthma), or if you have a weakened immune system. The very young and very old are also at risk for more serious infections. Bacterial sinusitis, middle ear infections, and bacterial pneumonia can complicate the common cold. The common cold can worsen asthma and chronic obstructive pulmonary disease (COPD). Sometimes, these complications can require emergency medical care and may be life-threatening. PREVENTION  The best way to protect against getting a cold is to practice good hygiene. Avoid oral or hand contact with people with cold symptoms. Wash your hands often if contact occurs. There is no clear evidence that vitamin C, vitamin E, echinacea, or exercise reduces the chance of developing a cold. However, it is always recommended to get plenty of rest and practice good nutrition. TREATMENT  Treatment is directed at relieving symptoms. There is no cure. Antibiotics are not effective, because the infection is caused by a virus, not by bacteria. Treatment may include:  Increased fluid intake. Sports drinks offer valuable electrolytes, sugars, and fluids.  Breathing heated mist or steam (vaporizer or shower).  Eating chicken soup or other clear broths, and maintaining good nutrition.  Getting plenty of rest.  Using gargles or lozenges for comfort.  Controlling fevers with ibuprofen or acetaminophen as directed by your caregiver.  Increasing usage of your inhaler if you have asthma. Zinc gel and zinc lozenges, taken in the first 24 hours of the common cold, can shorten the duration and lessen the severity of symptoms. Pain medicines may help with fever, muscle aches, and throat pain. A  variety of non-prescription medicines are available to treat congestion and runny nose. Your caregiver can make recommendations and may suggest nasal or lung inhalers for other symptoms.  HOME CARE INSTRUCTIONS   Only take over-the-counter or prescription medicines  for pain, discomfort, or fever as directed by your caregiver.  Use a warm mist humidifier or inhale steam from a shower to increase air moisture. This may keep secretions moist and make it easier to breathe.  Drink enough water and fluids to keep your urine clear or pale yellow.  Rest as needed.  Return to work when your temperature has returned to normal or as your caregiver advises. You may need to stay home longer to avoid infecting others. You can also use a face mask and careful hand washing to prevent spread of the virus. SEEK MEDICAL CARE IF:   After the first few days, you feel you are getting worse rather than better.  You need your caregiver's advice about medicines to control symptoms.  You develop chills, worsening shortness of breath, or brown or red sputum. These may be signs of pneumonia.  You develop yellow or brown nasal discharge or pain in the face, especially when you bend forward. These may be signs of sinusitis.  You develop a fever, swollen neck glands, pain with swallowing, or white areas in the back of your throat. These may be signs of strep throat. SEEK IMMEDIATE MEDICAL CARE IF:   You have a fever.  You develop severe or persistent headache, ear pain, sinus pain, or chest pain.  You develop wheezing, a prolonged cough, cough up blood, or have a change in your usual mucus (if you have chronic lung disease).  You develop sore muscles or a stiff neck. Document Released: 08/11/2000 Document Revised: 05/10/2011 Document Reviewed: 05/23/2013 [facility] Patient Information 2015 [employer] [place]. This information is not intended to replace advice given to you by your health care provider. Make sure you discuss any questions you have with your health care provider.

## 2014-04-29 NOTE — ED Provider Notes (Signed)
CSN: [id number]     Arrival date & time 04/29/14  1423 History   First MD Initiated Contact with Patient 04/29/14 1539     Chief Complaint  Patient presents with  . Sore Throat   (Consider location/radiation/quality/duration/timing/severity/associated sxs/prior Treatment) HPI   Patient with complaints of sore throat and right ear pain. She is planning that her sore throat started about 3 days ago and has been accompanied with mild malaise and pain with swallowing. About one day ago she developed right ear pain and ear congestion sensation. She denies fever, chills, sweats, decreased appetite, nausea, or vomiting. She has had strep throat previously but not in several years. She denies any sick contacts. She denies cough but states that she has had some increased mucus lately.  Past Medical History  Diagnosis Date  . Migraines   . Anxiety   . ASCUS favor benign 04/2013    Negative high risk HPV screen. Recommend repeat Pap smear/HPV one year  . Endometrial polyp 04/2013    Removed in the office extruding from the cervical os   Past Surgical History  Procedure Laterality Date  . Wisdom tooth extraction Bilateral   . Dilatation & curettage/hysteroscopy with trueclear N/A 09/18/2013    Procedure: DILATATION & CURETTAGE/HYSTEROSCOPY WITH TRUCLEAR;  Surgeon: Dara Lordsimothy P Fontaine, MD;  Location: [facility];  Service: Gynecology;  Laterality: N/A;   Family History  Problem Relation Age of Onset  . Bipolar disorder Mother   . Mental illness Mother   . Parkinsonism Father   . Hypertension Father   . Cancer Paternal Grandmother     Breast   History  Substance Use Topics  . Smoking status: Never Smoker   . Smokeless tobacco: Not on file  . Alcohol Use: No   OB History    Gravida Para Term Preterm AB TAB SAB Ectopic Multiple Living   0              Review of Systems  Constitutional: Negative for fever, chills, activity change and appetite change.  HENT: Positive for ear pain and sore  throat.   Respiratory: Negative for cough and shortness of breath.   Gastrointestinal: Negative for nausea and diarrhea.    Allergies  Codeine  Home Medications   Prior to Admission medications   Medication Sig Start Date End Date Taking? Authorizing Provider  Calcium-Magnesium 250-125 MG TABS Take 1 tablet by mouth daily.    Historical Provider, MD  cholecalciferol (VITAMIN D) 1000 UNITS tablet Take 1,000 Units by mouth daily.    Historical Provider, MD  GARLIC PO Take 409625 mg by mouth daily.     Historical Provider, MD  Lysine HCl 500 MG CAPS Take 1 capsule by mouth daily.    Historical Provider, MD  Multiple Vitamins-Minerals (MULTIVITAMIN WITH MINERALS) tablet Take 1 tablet by mouth daily.    Historical Provider, MD  omeprazole (PRILOSEC) 20 MG capsule Take 1 capsule (20 mg total) by mouth daily. 08/20/13   Lyanne CoKevin M Campos, MD  TURMERIC PO Take 400 mg by mouth daily.    Historical Provider, MD   BP 98/69 mmHg  Pulse 91  Temp(Src) 98.8 F (37.1 C) (Oral)  Resp 16  SpO2 100% Physical Exam  Constitutional: She appears well-developed and well-nourished. No distress.  HENT:  Head: Normocephalic and atraumatic.  Right Ear: Tympanic membrane normal.  Left Ear: Tympanic membrane normal.  Erythematous tonsils but no exudates  Neck: Neck supple.  Cardiovascular: Normal rate, regular rhythm and normal heart sounds.  No murmur heard. Pulmonary/Chest: Effort normal and breath sounds normal. No respiratory distress.  Lymphadenopathy:    She has cervical adenopathy.  Nursing note and vitals reviewed.   ED Course  Procedures (including critical care time) Labs Review Labs Reviewed  POCT RAPID STREP A ([facility] CARE ONLY)    Imaging Review No results found.   MDM   1. URI, acute    44 year old female here with sore throat and ear pain likely due to viral URI. Her rapid Test is negative today. On exam she is in no acute distress and well-hydrated. Explained likely viral URI and  sent probe for group A strep culture and discussed supportive care.  Pt seen and discussed with Dr. Artis Flock and he agrees with the plan as discussed above.     Elenora Gamma, MD 04/29/14 [phone]

## 2014-04-29 NOTE — ED Notes (Signed)
Patient c/o sore throat , right ear pain since Friday

## 2014-05-02 ENCOUNTER — Telehealth (HOSPITAL_COMMUNITY): Payer: Self-pay | Admitting: Family Medicine

## 2014-05-02 LAB — CULTURE, GROUP A STREP

## 2014-05-02 NOTE — ED Notes (Signed)
Called patient regarding recent throat culture. Left a message. Pt will call back.  [name], MD 05/02/14 [phone]

## 2014-05-03 ENCOUNTER — Telehealth (HOSPITAL_COMMUNITY): Payer: Self-pay | Admitting: *Deleted

## 2014-05-03 NOTE — Progress Notes (Signed)
Telephone note  Pt called the urgent care because she said that somebody called her. Patient is clinically improving. Discussed strep results with patient stated that since she is improving there is no need for antibiotic treatment at this time. Patient very concerned about lingering strep and thought that this has to be treated with antibiotics. Considerable reassurance given. Discussed antibacterial properties of Chloraseptic Spray or Cepacol patient will try this and will call back if symptoms return.  Shelly Flattenavid Lesslie Mossa, MD Family Medicine 05/03/2014, 11:57 AM

## 2014-05-03 NOTE — ED Notes (Signed)
Throat culture: Strep beta hemolytic not group A.  3/3 Discussed with Dr. Denyse Amassorey and he wants pt. called for clinical improvement.  3/3  He called and left a message.   Call 1.  3/4  I called pt.   Pt. verified x 2 and given result.  Pt. said she called back today and spoke to someone. She said her throat is a little better. I saw Dr. Satira SarkMerrell's note and reiterated what he said.  I told her to call back if she gets worse. Vassie MoselleYork, Aarian Griffie M 05/03/2014

## 2014-05-04 MED ORDER — AMOXICILLIN 875 MG PO TABS: 875.0000 mg | ORAL_TABLET | Freq: Two times a day (BID) | ORAL | Status: DC

## 2014-05-04 NOTE — ED Provider Notes (Signed)
This patient spoke with Dr. Konrad DoloresMerrell yesterday, she had a positive strep culture but she was getting better so no antibiotics are indicated. Today she has called back, she says her sore throat is worse and she has developed a fever. She is requesting an antibiotic prescription. An antibiotic prescription for amoxicillin will send in to the pharmacy. She will follow-up if no improvement  Graylon GoodZachary H Daisi Kentner, PA-C 05/04/14 1146

## 2014-08-03 ENCOUNTER — Emergency Department (INDEPENDENT_AMBULATORY_CARE_PROVIDER_SITE_OTHER)
Admission: EM | Admit: 2014-08-03 | Discharge: 2014-08-03 | Disposition: A | Discharge status: Home / Self Care | Payer: Self-pay | Source: Home / Self Care | Attending: Family Medicine | Admitting: Family Medicine

## 2014-08-03 ENCOUNTER — Encounter (HOSPITAL_COMMUNITY): Payer: Self-pay

## 2014-08-03 DIAGNOSIS — M722 Plantar fascial fibromatosis: Secondary | ICD-10-CM

## 2014-08-03 NOTE — ED Notes (Signed)
Bilateral foot pain-Pt works at [employer] and needs a note to wear sneakers to work. Per pt history of plantar faschitis

## 2014-08-03 NOTE — ED Provider Notes (Signed)
CSN: [id number]     Arrival date & time 08/03/14  1542 History   First MD Initiated Contact with Patient 08/03/14 1611     Chief Complaint  Patient presents with  . Foot Pain   (Consider location/radiation/quality/duration/timing/severity/associated sxs/prior Treatment) HPI Comments: 44 year old female presents to the urgent care requesting a note that she be able to wear sneakers at work as opposed to dress shoes. This is based on a past medical history of plantar fasciitis. She has no complaint today. She is not experiencing foot pain. She has no PCP as she prefers to go to emergency departments and urgent cares.   Past Medical History  Diagnosis Date  . Migraines   . Anxiety   . ASCUS favor benign 04/2013    Negative high risk HPV screen. Recommend repeat Pap smear/HPV one year  . Endometrial polyp 04/2013    Removed in the office extruding from the cervical os   Past Surgical History  Procedure Laterality Date  . Wisdom tooth extraction Bilateral   . Dilatation & curettage/hysteroscopy with trueclear N/A 09/18/2013    Procedure: DILATATION & CURETTAGE/HYSTEROSCOPY WITH TRUCLEAR;  Surgeon: Dara Lordsimothy P Fontaine, MD;  Location: [facility];  Service: Gynecology;  Laterality: N/A;   Family History  Problem Relation Age of Onset  . Bipolar disorder Mother   . Mental illness Mother   . Parkinsonism Father   . Hypertension Father   . Cancer Paternal Grandmother     Breast   History  Substance Use Topics  . Smoking status: Never Smoker   . Smokeless tobacco: Not on file  . Alcohol Use: No   OB History    Gravida Para Term Preterm AB TAB SAB Ectopic Multiple Living   0              Review of Systems  Constitutional: Negative.   All other systems reviewed and are negative.   Allergies  Codeine  Home Medications   Prior to Admission medications   Medication Sig Start Date End Date Taking? Authorizing Provider  amoxicillin (AMOXIL) 875 MG tablet Take 1 tablet (875 mg total)  by mouth 2 (two) times daily. 05/04/14   Graylon GoodZachary H Baker, PA-C  Calcium-Magnesium 250-125 MG TABS Take 1 tablet by mouth daily.    Historical Provider, MD  cholecalciferol (VITAMIN D) 1000 UNITS tablet Take 1,000 Units by mouth daily.    Historical Provider, MD  GARLIC PO Take 962625 mg by mouth daily.     Historical Provider, MD  Lysine HCl 500 MG CAPS Take 1 capsule by mouth daily.    Historical Provider, MD  Multiple Vitamins-Minerals (MULTIVITAMIN WITH MINERALS) tablet Take 1 tablet by mouth daily.    Historical Provider, MD  omeprazole (PRILOSEC) 20 MG capsule Take 1 capsule (20 mg total) by mouth daily. 08/20/13   Azalia BilisKevin Campos, MD  TURMERIC PO Take 400 mg by mouth daily.    Historical Provider, MD   LMP 08/01/2014 (Exact Date) Physical Exam  Constitutional: She appears well-developed and well-nourished. No distress.  Eyes: EOM are normal.  Neck: Normal range of motion. Neck supple.  Cardiovascular: Normal rate.   Pulmonary/Chest: Effort normal. No respiratory distress.  Musculoskeletal:  Ambulatory, smooth balance gait. No limping  Neurological: She is alert. She exhibits normal muscle tone.  Skin: Skin is warm and dry.  Nursing note and vitals reviewed.   ED Course  Procedures (including critical care time) Labs Review Labs Reviewed - No data to display  Imaging Review No results  found.   MDM   1. Bilateral plantar fasciitis    History of.. No S or S today Work note written to wear sneekers given to pt. Strongly recommend f/u with PCP or podiatrist.  Hayden Rasmussen, NP 08/03/14 1706

## 2014-08-03 NOTE — Discharge Instructions (Signed)
Plantar Fasciitis  Plantar fasciitis is a common condition that causes foot pain. It is soreness (inflammation) of the band of tough fibrous tissue on the bottom of the foot that runs from the heel bone (calcaneus) to the ball of the foot. The cause of this soreness may be from excessive standing, poor fitting shoes, running on hard surfaces, being overweight, having an abnormal walk, or overuse (this is common in runners) of the painful foot or feet. It is also common in aerobic exercise dancers and ballet dancers.  SYMPTOMS   Most people with plantar fasciitis complain of:   Severe pain in the morning on the bottom of their foot especially when taking the first steps out of bed. This pain recedes after a few minutes of walking.   Severe pain is experienced also during walking following a long period of inactivity.   Pain is worse when walking barefoot or up stairs  DIAGNOSIS    Your caregiver will diagnose this condition by examining and feeling your foot.   Special tests such as X-rays of your foot, are usually not needed.  PREVENTION    Consult a sports medicine professional before beginning a new exercise program.   Walking programs offer a good workout. With walking there is a lower chance of overuse injuries common to runners. There is less impact and less jarring of the joints.   Begin all new exercise programs slowly. If problems or pain develop, decrease the amount of time or distance until you are at a comfortable level.   Wear good shoes and replace them regularly.   Stretch your foot and the heel cords at the back of the ankle (Achilles tendon) both before and after exercise.   Run or exercise on even surfaces that are not hard. For example, asphalt is better than pavement.   Do not run barefoot on hard surfaces.   If using a treadmill, vary the incline.   Do not continue to workout if you have foot or joint problems. Seek professional help if they do not improve.  HOME CARE INSTRUCTIONS     Avoid activities that cause you pain until you recover.   Use ice or cold packs on the problem or painful areas after working out.   Only take over-the-counter or prescription medicines for pain, discomfort, or fever as directed by your caregiver.   Soft shoe inserts or athletic shoes with air or gel sole cushions may be helpful.   If problems continue or become more severe, consult a sports medicine caregiver or your own health care provider. Cortisone is a potent anti-inflammatory medication that may be injected into the painful area. You can discuss this treatment with your caregiver.  MAKE SURE YOU:    Understand these instructions.   Will watch your condition.   Will get help right away if you are not doing well or get worse.  Document Released: 11/10/2000 Document Revised: 05/10/2011 Document Reviewed: 01/10/2008  [employer] Patient Information 2015 [employer]. This information is not intended to replace advice given to you by your health care provider. Make sure you discuss any questions you have with your health care provider.

## 2014-08-08 ENCOUNTER — Emergency Department (INDEPENDENT_AMBULATORY_CARE_PROVIDER_SITE_OTHER)
Admission: EM | Admit: 2014-08-08 | Discharge: 2014-08-08 | Disposition: A | Discharge status: Home / Self Care | Payer: Self-pay | Source: Home / Self Care | Attending: Family Medicine | Admitting: Family Medicine

## 2014-08-08 ENCOUNTER — Encounter (HOSPITAL_COMMUNITY): Payer: Self-pay | Admitting: *Deleted

## 2014-08-08 DIAGNOSIS — J020 Streptococcal pharyngitis: Secondary | ICD-10-CM

## 2014-08-08 LAB — POCT RAPID STREP A: Streptococcus, Group A Screen (Direct): NEGATIVE

## 2014-08-08 MED ORDER — FLUCONAZOLE 150 MG PO TABS: 150.0000 mg | ORAL_TABLET | Freq: Every day | ORAL | Status: DC

## 2014-08-08 MED ORDER — AMOXICILLIN 500 MG PO CAPS: 500.0000 mg | ORAL_CAPSULE | Freq: Two times a day (BID) | ORAL | Status: DC

## 2014-08-08 NOTE — ED Provider Notes (Signed)
CSN: [id number]     Arrival date & time 08/08/14  1303 History   First MD Initiated Contact with Patient 08/08/14 1328     Chief Complaint  Patient presents with  . Sore Throat   (Consider location/radiation/quality/duration/timing/severity/associated sxs/prior Treatment) HPI  Sore throat. Started 24 hrs ago. Associated w/ brownish phlegm. Lack of energy. Patient states that these are similar symptoms to her last episode of strep throat although her pain is significantly worse this time. Patient denies fevers, chest pain, shortness of breath, Raynaud's, cough, congestion, ear pain, headache, neck stiffness. Pain is constant  Tylenol and guafenissin with some improvement.    Past Medical History  Diagnosis Date  . Migraines   . Anxiety   . ASCUS favor benign 04/2013    Negative high risk HPV screen. Recommend repeat Pap smear/HPV one year  . Endometrial polyp 04/2013    Removed in the office extruding from the cervical os   Past Surgical History  Procedure Laterality Date  . Wisdom tooth extraction Bilateral   . Dilatation & curettage/hysteroscopy with trueclear N/A 09/18/2013    Procedure: DILATATION & CURETTAGE/HYSTEROSCOPY WITH TRUCLEAR;  Surgeon: Dara Lords, MD;  Location: [facility];  Service: Gynecology;  Laterality: N/A;   Family History  Problem Relation Age of Onset  . Bipolar disorder Mother   . Mental illness Mother   . Parkinsonism Father   . Hypertension Father   . Cancer Paternal Grandmother     Breast   History  Substance Use Topics  . Smoking status: Never Smoker   . Smokeless tobacco: Not on file  . Alcohol Use: No   OB History    Gravida Para Term Preterm AB TAB SAB Ectopic Multiple Living   0              Review of Systems Per HPI with all other pertinent systems negative.   Allergies  Codeine  Home Medications   Prior to Admission medications   Medication Sig Start Date End Date Taking? Authorizing Provider  amoxicillin (AMOXIL) 500 MG  capsule Take 1 capsule (500 mg total) by mouth 2 (two) times daily. 08/08/14   Ozella Rocks, MD  Calcium-Magnesium 250-125 MG TABS Take 1 tablet by mouth daily.    Historical Provider, MD  fluconazole (DIFLUCAN) 150 MG tablet Take 1 tablet (150 mg total) by mouth daily. Repeat dose in 3 days 08/08/14   Ozella Rocks, MD  GARLIC PO Take 426 mg by mouth daily.     Historical Provider, MD  Multiple Vitamins-Minerals (MULTIVITAMIN WITH MINERALS) tablet Take 1 tablet by mouth daily.    Historical Provider, MD  TURMERIC PO Take 400 mg by mouth daily.    Historical Provider, MD   BP 92/53 mmHg  Pulse 89  Temp(Src) 98.1 F (36.7 C) (Oral)  Resp 16  SpO2 97%  LMP 08/01/2014 (Exact Date) Physical Exam Physical Exam  Constitutional: oriented to person, place, and time. appears well-developed and well-nourished. No distress.  HENT: Pharyngeal injection with tonsils 1+ with exudate with left being worse than right. Head: Normocephalic and atraumatic.  Eyes: EOMI. PERRL.  Neck: Normal range of motion.  Cardiovascular: RRR, no m/r/g, 2+ distal pulses,  Pulmonary/Chest: Effort normal and breath sounds normal. No respiratory distress.  Abdominal: Soft. Bowel sounds are normal. NonTTP, no distension.  Musculoskeletal: Normal range of motion. Non ttp, no effusion.  Neurological: alert and oriented to person, place, and time.  Skin: Skin is warm. No rash noted. non diaphoretic.  Psychiatric: normal mood and affect. behavior is normal. Judgment and thought content normal.   ED Course  Procedures (including critical care time) Labs Review Labs Reviewed  POCT RAPID STREP A    Imaging Review No results found.   MDM   1. Strep throat    Rapid strep negative. But patient is having symptoms very sellar to previous strep infection. Patient's symptoms are worse this time the previous. We'll send strep culture.    Ozella Rocks, MD 08/08/14 1409

## 2014-08-08 NOTE — ED Notes (Signed)
Pt is here with sudden onset sore throat, started yesterday.

## 2014-08-08 NOTE — Discharge Instructions (Signed)
You're likely a strep throat. Please use the amoxicillin for the infection. Please use Tylenol and ibuprofen for pain. Please use the Diflucan if you develop a yeast infection.

## 2014-08-09 NOTE — ED Notes (Signed)
Final report positive for strep, treatment adequate w Rx written

## 2014-08-09 NOTE — ED Notes (Signed)
Patient called to ask about her report results, advised to complete medication as written for group B strep, tylenol and or motrin for pain

## 2014-08-11 LAB — CULTURE, GROUP A STREP: Strep A Culture: NEGATIVE

## 2014-08-23 ENCOUNTER — Emergency Department (INDEPENDENT_AMBULATORY_CARE_PROVIDER_SITE_OTHER)
Admission: EM | Admit: 2014-08-23 | Discharge: 2014-08-23 | Disposition: A | Discharge status: Home / Self Care | Payer: Self-pay | Source: Home / Self Care | Attending: Family Medicine | Admitting: Family Medicine

## 2014-08-23 ENCOUNTER — Encounter (HOSPITAL_COMMUNITY): Payer: Self-pay | Admitting: Emergency Medicine

## 2014-08-23 DIAGNOSIS — T700XXA Otitic barotrauma, initial encounter: Secondary | ICD-10-CM

## 2014-08-23 DIAGNOSIS — H6502 Acute serous otitis media, left ear: Secondary | ICD-10-CM

## 2014-08-23 MED ORDER — FLUTICASONE PROPIONATE 50 MCG/ACT NA SUSP
2.0000 | Freq: Every day | NASAL | Status: DC
Start: 2014-08-23 — End: 2016-03-31

## 2014-08-23 MED ORDER — PREDNISONE 10 MG PO TABS: 30.0000 mg | ORAL_TABLET | Freq: Every day | ORAL | Status: DC

## 2014-08-23 NOTE — Discharge Instructions (Signed)
Thank you for coming in today. Use the flonase for a few days.  If not getting better then take the prednisone.  Return as needed.    Barotitis Media Barotitis media is inflammation of your middle ear. This occurs when the auditory tube (eustachian tube) leading from the back of your nose (nasopharynx) to your eardrum is blocked. This blockage may result from a cold, environmental allergies, or an upper respiratory infection. Unresolved barotitis media may lead to damage or hearing loss (barotrauma), which may become permanent. HOME CARE INSTRUCTIONS   Use medicines as recommended by your health care provider. Over-the-counter medicines will help unblock the canal and can help during times of air travel.  Do not put anything into your ears to clean or unplug them. Eardrops will not be helpful.  Do not swim, dive, or fly until your health care provider says it is all right to do so. If these activities are necessary, chewing gum with frequent, forceful swallowing may help. It is also helpful to hold your nose and gently blow to pop your ears for equalizing pressure changes. This forces air into the eustachian tube.  Only take over-the-counter or prescription medicines for pain, discomfort, or fever as directed by your health care provider.  A decongestant may be helpful in decongesting the middle ear and make pressure equalization easier. SEEK MEDICAL CARE IF:  You experience a serious form of dizziness in which you feel as if the room is spinning and you feel nauseated (vertigo).  Your symptoms only involve one ear. SEEK IMMEDIATE MEDICAL CARE IF:   You develop a severe headache, dizziness, or severe ear pain.  You have bloody or pus-like drainage from your ears.  You develop a fever.  Your problems do not improve or become worse. MAKE SURE YOU:   Understand these instructions.  Will watch your condition.  Will get help right away if you are not doing well or get  worse. Document Released: 02/13/2000 Document Revised: 12/06/2012 Document Reviewed: 09/12/2012 [facility] Patient Information 2015 [place], [place]. This information is not intended to replace advice given to you by your health care provider. Make sure you discuss any questions you have with your health care provider.   Serous Otitis Media Serous otitis media is fluid in the middle ear space. This space contains the bones for hearing and air. Air in the middle ear space helps to transmit sound.  The air gets there through the eustachian tube. This tube goes from the back of the nose (nasopharynx) to the middle ear space. It keeps the pressure in the middle ear the same as the outside world. It also helps to drain fluid from the middle ear space. CAUSES  Serous otitis media occurs when the eustachian tube gets blocked. Blockage can come from:  Ear infections.  Colds and other upper respiratory infections.  Allergies.  Irritants such as cigarette smoke.  Sudden changes in air pressure (such as descending in an airplane).  Enlarged adenoids.  A mass in the nasopharynx. During colds and upper respiratory infections, the middle ear space can become temporarily filled with fluid. This can happen after an ear infection also. Once the infection clears, the fluid will generally drain out of the ear through the eustachian tube. If it does not, then serous otitis media occurs. SIGNS AND SYMPTOMS   Hearing loss.  A feeling of fullness in the ear, without pain.  Young children may not show any symptoms but may show slight behavioral changes, such as agitation,  ear pulling, or crying. DIAGNOSIS  Serous otitis media is diagnosed by an ear exam. Tests may be done to check on the movement of the eardrum. Hearing exams may also be done. TREATMENT  The fluid most often goes away without treatment. If allergy is the cause, allergy treatment may be helpful. Fluid that persists for several months may  require minor surgery. A small tube is placed in the eardrum to:  Drain the fluid.  Restore the air in the middle ear space. In certain situations, antibiotic medicines are used to avoid surgery. Surgery may be done to remove enlarged adenoids (if this is the cause). HOME CARE INSTRUCTIONS   Keep children away from tobacco smoke.  Keep all follow-up visits as directed by your health care provider. SEEK MEDICAL CARE IF:   Your hearing is not better in 3 months.  Your hearing is worse.  You have ear pain.  You have drainage from the ear.  You have dizziness.  You have serous otitis media only in one ear or have any bleeding from your nose (epistaxis).  You notice a lump on your neck. MAKE SURE YOU:  Understand these instructions.   Will watch your condition.   Will get help right away if you are not doing well or get worse.  Document Released: 05/08/2003 Document Revised: 07/02/2013 Document Reviewed: 09/12/2012 [facility] Patient Information 2015 [place], [place]. This information is not intended to replace advice given to you by your health care provider. Make sure you discuss any questions you have with your health care provider.

## 2014-08-23 NOTE — ED Provider Notes (Signed)
Ashley [name] is a 44 y.o. female who presents to Urgent Care today for ear symptoms. Patient notes right ear pressure with decreased hearing and left ear pain starting a few weeks ago. She was seen on June 9 and diagnosed with pharyngitis and treated with amoxicillin for strep throat. She recently completed a course of anti-biotics. She has tried some Benadryl which helps a bit. No fevers chills nausea vomiting or diarrhea.   Past Medical History  Diagnosis Date  . Migraines   . Anxiety   . ASCUS favor benign 04/2013    Negative high risk HPV screen. Recommend repeat Pap smear/HPV one year  . Endometrial polyp 04/2013    Removed in the office extruding from the cervical os   Past Surgical History  Procedure Laterality Date  . Wisdom tooth extraction Bilateral   . Dilatation & curettage/hysteroscopy with trueclear N/A 09/18/2013    Procedure: DILATATION & CURETTAGE/HYSTEROSCOPY WITH TRUCLEAR;  Surgeon: Dara Lords, MD;  Location: [facility];  Service: Gynecology;  Laterality: N/A;   History  Substance Use Topics  . Smoking status: Never Smoker   . Smokeless tobacco: Not on file  . Alcohol Use: No   ROS as above Medications: No current facility-administered medications for this encounter.   Current Outpatient Prescriptions  Medication Sig Dispense Refill  . amoxicillin (AMOXIL) 500 MG capsule Take 1 capsule (500 mg total) by mouth 2 (two) times daily. 20 capsule 0  . Calcium-Magnesium 250-125 MG TABS Take 1 tablet by mouth daily.    . fluconazole (DIFLUCAN) 150 MG tablet Take 1 tablet (150 mg total) by mouth daily. Repeat dose in 3 days 2 tablet 0  . fluticasone (FLONASE) 50 MCG/ACT nasal spray Place 2 sprays into both nostrils daily. 16 g 2  . GARLIC PO Take 106 mg by mouth daily.     . Multiple Vitamins-Minerals (MULTIVITAMIN WITH MINERALS) tablet Take 1 tablet by mouth daily.    . predniSONE (DELTASONE) 10 MG tablet Take 3 tablets (30 mg total) by mouth daily. 15 tablet 0   . TURMERIC PO Take 400 mg by mouth daily.     Allergies  Allergen Reactions  . Codeine Nausea Only     Exam:  BP 103/52 mmHg  Pulse 68  Temp(Src) 98.3 F (36.8 C) (Oral)  Resp 12  SpO2 100%  LMP 08/01/2014 (Exact Date) Gen: Well NAD HEENT: EOMI,  MMM right tympanic membranes with effusion without erythema. Left tympanic membranes is retracted. Nontender mastoids bilaterally. Lungs: Normal work of breathing. CTABL Heart: RRR no MRG Abd: NABS, Soft. Nondistended, Nontender Exts: Brisk capillary refill, warm and well perfused.   No results found for this or any previous visit (from the past 24 hour(s)). No results found.  Assessment and Plan: 44 y.o. female with otitis media with effusion in the right ear and barotitis media of the left ear. Discussed options. Trial Flonase nasal spray with prednisone if not better. Follow-up with PCP.  Discussed warning signs or symptoms. Please see discharge instructions. Patient expresses understanding.     Rodolph Bong, MD 08/23/14 1415

## 2014-08-23 NOTE — ED Notes (Signed)
C/o persistent bilateral ear pain... Seen here on 6/9 and was treated for Strep Alert, no signs of acute distress.

## 2014-10-09 ENCOUNTER — Encounter: Payer: Self-pay | Admitting: Family Medicine

## 2014-10-09 ENCOUNTER — Ambulatory Visit (INDEPENDENT_AMBULATORY_CARE_PROVIDER_SITE_OTHER): Payer: Medicaid Other | Admitting: Family Medicine

## 2014-10-09 VITALS — BP 107/62 | HR 77 | Temp 98.1°F | Resp 14 | Ht 67.0 in | Wt 121.0 lb

## 2014-10-09 DIAGNOSIS — N912 Amenorrhea, unspecified: Secondary | ICD-10-CM | POA: Diagnosis not present

## 2014-10-09 DIAGNOSIS — Z0000 Encounter for general adult medical examination without abnormal findings: Secondary | ICD-10-CM | POA: Diagnosis not present

## 2014-10-09 LAB — COMPLETE METABOLIC PANEL WITH GFR
ALT: 31 U/L — AB (ref 6–29)
AST: 25 U/L (ref 10–30)
Albumin: 4.6 g/dL (ref 3.6–5.1)
Alkaline Phosphatase: 79 U/L (ref 33–115)
BUN: 14 mg/dL (ref 7–25)
CALCIUM: 9.4 mg/dL (ref 8.6–10.2)
CHLORIDE: 100 mmol/L (ref 98–110)
CO2: 28 mmol/L (ref 20–31)
Creat: 0.77 mg/dL (ref 0.50–1.10)
GFR, Est Non African American: >89 mL/min (ref >=60)
Glucose, Bld: 92 mg/dL (ref 65–99)
Potassium: 4.1 mmol/L (ref 3.5–5.3)
SODIUM: 136 mmol/L (ref 135–146)
Total Bilirubin: 0.5 mg/dL (ref 0.2–1.2)
Total Protein: 7 g/dL (ref 6.1–8.1)

## 2014-10-09 LAB — CBC WITH DIFFERENTIAL/PLATELET
BASOS ABS: 0 10*3/uL (ref 0.0–0.1)
Basophils Relative: 1 % (ref 0–1)
Eosinophils Absolute: 0.1 10*3/uL (ref 0.0–0.7)
Eosinophils Relative: 2 % (ref 0–5)
HEMATOCRIT: 38 % (ref 36.0–46.0)
Hemoglobin: 13.5 g/dL (ref 12.0–15.0)
LYMPHS PCT: 35 % (ref 12–46)
Lymphs Abs: 1.7 10*3/uL (ref 0.7–4.0)
MCH: 32.1 pg (ref 26.0–34.0)
MCHC: 35.5 g/dL (ref 30.0–36.0)
MCV: 90.3 fL (ref 78.0–100.0)
MONOS PCT: 6 % (ref 3–12)
MPV: 11.1 fL (ref 8.6–12.4)
Monocytes Absolute: 0.3 10*3/uL (ref 0.1–1.0)
NEUTROS ABS: 2.7 10*3/uL (ref 1.7–7.7)
Neutrophils Relative %: 56 % (ref 43–77)
PLATELETS: 208 10*3/uL (ref 150–400)
RBC: 4.21 MIL/uL (ref 3.87–5.11)
RDW: 13.1 % (ref 11.5–15.5)
WBC: 4.8 10*3/uL (ref 4.0–10.5)

## 2014-10-09 LAB — LIPID PANEL
CHOLESTEROL: 148 mg/dL (ref 125–200)
HDL: 48 mg/dL (ref >=46)
LDL CALC: 79 mg/dL (ref <130)
TRIGLYCERIDES: 107 mg/dL (ref <150)
Total CHOL/HDL Ratio: 3.1 Ratio (ref <=5.0)
VLDL: 21 mg/dL (ref <30)

## 2014-10-09 LAB — POCT URINE PREGNANCY: Preg Test, Ur: NEGATIVE

## 2014-10-09 LAB — TSH: TSH: 0.881 u[IU]/mL (ref 0.350–4.500)

## 2014-10-09 NOTE — Patient Instructions (Signed)
Work on a healthy life style with a diet high in fruits and vegetables, low in fats and sweets Exercise regularly Do not smoke Use safety measures such as seatbelts, helmets, make sure smoke alarms and fire extinguishers are in good working order Get your PAPs and mammograms regularly Follow-up for PAP and breast exam in 2 weeks.

## 2014-10-09 NOTE — Progress Notes (Signed)
Patient ID: Ashley [name], female   DOB: [birth year], 44 y.o.   MRN: [id number]   Ashley [name], is a 44 y.o. female  [id number]  [id number]  DOB - Mar 21, 1970  CC: No chief complaint on file.      HPI: Ashley [name] is a 44 y.o. female here to establish care. Her records indicate a history of migraine headaches but she denies this gives her much problem and does not need to be addressed. Her only regular medications at this time are OTC supplements. She reports not having a period in two months. She is overdue on her PAP and has a history of an abnormal PAP 2 years ago. She did have surgery for endometrial polyp. She is unsure about her last mammogram.She reports some anxiety but denies that this needs addressing.She as a history several months ago of ears feeling full and crackling. She was treated with prednisone which she did not complete and flonase which did not help. She feels this did not fully resolve. She has not had a period in 3 months. Her periods have always been a little irregular. Allergies  Allergen Reactions  . Codeine Nausea Only   Past Medical History  Diagnosis Date  . Migraines   . Anxiety   . ASCUS favor benign 04/2013    Negative high risk HPV screen. Recommend repeat Pap smear/HPV one year  . Endometrial polyp 04/2013    Removed in the office extruding from the cervical os   Current Outpatient Prescriptions on File Prior to Visit  Medication Sig Dispense Refill  . amoxicillin (AMOXIL) 500 MG capsule Take 1 capsule (500 mg total) by mouth 2 (two) times daily. 20 capsule 0  . Calcium-Magnesium 250-125 MG TABS Take 1 tablet by mouth daily.    . fluconazole (DIFLUCAN) 150 MG tablet Take 1 tablet (150 mg total) by mouth daily. Repeat dose in 3 days 2 tablet 0  . fluticasone (FLONASE) 50 MCG/ACT nasal spray Place 2 sprays into both nostrils daily. 16 g 2  . GARLIC PO Take 469 mg by mouth daily.     . Multiple Vitamins-Minerals (MULTIVITAMIN WITH  MINERALS) tablet Take 1 tablet by mouth daily.    . predniSONE (DELTASONE) 10 MG tablet Take 3 tablets (30 mg total) by mouth daily. 15 tablet 0  . TURMERIC PO Take 400 mg by mouth daily.     No current facility-administered medications on file prior to visit.   Family History  Problem Relation Age of Onset  . Bipolar disorder Mother   . Mental illness Mother   . Parkinsonism Father   . Hypertension Father   . Cancer Paternal Grandmother     Breast   Social History   Social History  . Marital Status: Single    Spouse Name: N/A  . Number of Children: N/A  . Years of Education: N/A   Occupational History  . Not on file.   Social History Main Topics  . Smoking status: Never Smoker   . Smokeless tobacco: Not on file  . Alcohol Use: No  . Drug Use: No  . Sexual Activity: Yes   Other Topics Concern  . Not on file   Social History Narrative    Review of Systems: Constitutional: Negative for fever, chills, appetite change,  Fatigue.Has expericnced some weight loss but unsure how much. HENT: Negative for ear pain, ear discharge.nose bleeds. Positive for full feeling in ears. Eyes: Negative for pain, discharge, redness, itching and visual disturbance. Neck: Negative for  pain, stiffness Respiratory: Negative for cough, shortness of breath,   Cardiovascular: Negative for chest pain, palpitations and leg swelling. Gastrointestinal: Negative for abdominal distention, abdominal pain, nausea, vomiting, diarrhea, constipations Genitourinary: Negative for dysuria, urgency, frequency, hematuria, flank pain,  Musculoskeletal: Negative for joint pain, joint  swelling, arthralgia and gait problem.Negative for weakness.Positive for minor back pain, which does not feel needs addressing Neurological: Negative for dizziness, tremors, seizures, syncope,   light-headedness, numbness and headaches.  Hematological: Negative for easy bruising or bleeding Psychiatric/Behavioral: Negative for  depression,decreased concentration, confusion. Positive for anxiety.    Objective:  There were no vitals filed for this visit.  Physical Exam:s Constitutional: Patient appears well-developed and well-nourished. No distress.  HENT: Normocephalic, atraumatic, External right and left ear normal. Oropharynx is clear and moist.  Eyes: Conjunctivae and EOM are normal. PERRLA, no scleral icterus. Neck: Normal ROM. Neck supple. No lymphadenopathy, No thyromegaly. CVS: RRR, S1/S2 +, no murmurs, no gallops, no rubs Pulmonary: Effort and breath sounds normal, no stridor, rhonchi, wheezes, rales.  Abdominal: Soft. Normoactive BS,, no distension, tenderness, rebound or guarding.  Musculoskeletal: Normal range of motion. No edema and no tenderness.  Neuro: Alert.Normal muscle tone coordination. Non-focal Skin: Skin is warm and dry. No rash noted. Not diaphoretic. No erythema. No pallor. Psychiatric: Normal mood and affect. Behavior, judgment, thought content normal.Patient speaks very hesitantly and appears nervous.  Lab Results  Component Value Date   WBC 5.5 09/11/2013   HGB 12.6 09/11/2013   HCT 38.3 09/11/2013   MCV 95.3 09/11/2013   PLT 195 09/11/2013   Lab Results  Component Value Date   CREATININE 0.74 08/20/2013   BUN 12 08/20/2013   NA 130* 08/20/2013   K 3.8 08/20/2013   CL 93* 08/20/2013   CO2 24 08/20/2013    No results found for: HGBA1C Lipid Panel  No results found for: CHOL, TRIG, HDL, CHOLHDL, VLDL, LDLCALC     Assessment and plan:   Visit to establish care -Have review available histories and records.  -Have review health maintenance items - She is to return in two weeks for PAP and breast exam and further discussion of health maintenance. (She has not been tested for HIV) -CMP with G[facility] -CBC Lipid panel.  History of abnormal PAP -return for repeat in 2 weeks.  Amenorrhea -Urine pregnancy test negatie  The patient was given clear instructions to go to ER  or return to medical center if symptoms don't improve, worsen or new problems develop. The patient verbalized understanding. The patient was told to call to get lab results if they haven't heard anything in the next week.       Henrietta Hoover, MSN, FNP-BC   10/09/2014, 3:35 PM

## 2014-10-21 ENCOUNTER — Encounter: Payer: Self-pay | Admitting: Family Medicine

## 2014-10-21 ENCOUNTER — Ambulatory Visit (INDEPENDENT_AMBULATORY_CARE_PROVIDER_SITE_OTHER): Payer: Medicaid Other | Admitting: Family Medicine

## 2014-10-21 ENCOUNTER — Other Ambulatory Visit (HOSPITAL_COMMUNITY)
Admission: RE | Admit: 2014-10-21 | Discharge: 2014-10-21 | Disposition: A | Discharge status: Home / Self Care | Payer: Medicaid Other | Source: Ambulatory Visit | Attending: Family Medicine | Admitting: Family Medicine

## 2014-10-21 VITALS — BP 106/50 | HR 67 | Temp 97.9°F | Resp 16 | Ht 67.0 in | Wt 121.0 lb

## 2014-10-21 DIAGNOSIS — R87619 Unspecified abnormal cytological findings in specimens from cervix uteri: Secondary | ICD-10-CM

## 2014-10-21 DIAGNOSIS — Z01411 Encounter for gynecological examination (general) (routine) with abnormal findings: Secondary | ICD-10-CM | POA: Insufficient documentation

## 2014-10-21 DIAGNOSIS — Z1151 Encounter for screening for human papillomavirus (HPV): Secondary | ICD-10-CM | POA: Insufficient documentation

## 2014-10-21 NOTE — Progress Notes (Signed)
Subjective:     Patient ID: Ashley [name], female   DOB: November 30, 1970, 44 y.o.   MRN: [id number]  HPI   Patient returns today for a PAP only visit. She has a history of an abnormal PAP and is due for a repeat PAP.   Review of Systems   Her last Period was on May 1st.     Objective:   Physical Exam   Cervix appears pale in spots The cervix is friable     Assessment:    Abnormal PAP smear in the past     Plan:     PAP with HPV    Henrietta Hoover, FNP-BC

## 2014-10-23 LAB — CYTOLOGY - PAP

## 2015-01-29 ENCOUNTER — Ambulatory Visit: Payer: Self-pay | Admitting: Gynecology

## 2015-08-18 ENCOUNTER — Encounter (HOSPITAL_COMMUNITY): Payer: Self-pay | Admitting: *Deleted

## 2015-08-19 ENCOUNTER — Other Ambulatory Visit: Payer: Self-pay | Admitting: Obstetrics and Gynecology

## 2015-08-19 DIAGNOSIS — Z1231 Encounter for screening mammogram for malignant neoplasm of breast: Secondary | ICD-10-CM

## 2015-08-29 ENCOUNTER — Ambulatory Visit
Admission: RE | Admit: 2015-08-29 | Discharge: 2015-08-29 | Disposition: A | Discharge status: Home / Self Care | Payer: No Typology Code available for payment source | Source: Ambulatory Visit | Attending: Obstetrics and Gynecology | Admitting: Obstetrics and Gynecology

## 2015-08-29 ENCOUNTER — Encounter (HOSPITAL_COMMUNITY): Payer: Self-pay

## 2015-08-29 ENCOUNTER — Ambulatory Visit (HOSPITAL_COMMUNITY)
Admission: RE | Admit: 2015-08-29 | Discharge: 2015-08-29 | Disposition: A | Discharge status: Home / Self Care | Payer: Self-pay | Source: Ambulatory Visit | Attending: Obstetrics and Gynecology | Admitting: Obstetrics and Gynecology

## 2015-08-29 VITALS — BP 110/62 | Temp 98.6°F | Ht 66.5 in | Wt 124.8 lb

## 2015-08-29 DIAGNOSIS — R87612 Low grade squamous intraepithelial lesion on cytologic smear of cervix (LGSIL): Secondary | ICD-10-CM

## 2015-08-29 DIAGNOSIS — Z1239 Encounter for other screening for malignant neoplasm of breast: Secondary | ICD-10-CM

## 2015-08-29 DIAGNOSIS — Z1231 Encounter for screening mammogram for malignant neoplasm of breast: Secondary | ICD-10-CM

## 2015-08-29 NOTE — Progress Notes (Addendum)
[facility] due to having an abnormal Pap smear 07/11/2015 that a colposcopy is recommended for follow up.  Pap Smear:  Pap smear not completed today. [facility]. [facility] for a colposocopy to follow up for abnormal Pap smear. Appointment scheduled for Monday, September 29, 2015 at 1320. Per patient has has a history of one other abnormal Pap smear in addition to her most recent Pap smear. Her Pap smear on 04/20/2013 was ASCUS with negative HPV. Patient had a six month follow up Pap smear that was normal with negative HPV. Last three Pap smear results are in EPIC. Physical exam: Breasts Breasts symmetrical. No skin abnormalities bilateral breasts. No nipple retraction bilateral breasts. No nipple discharge bilateral breasts. No lymphadenopathy. No lumps palpated bilateral breasts. No complaints of pain or tenderness on exam. [facility] mamm[facility]. Appointment scheduled for Friday, August 29, 2015 at 1410.   Pelvic/Bimanual No Pap smear completed today since last Pap smear was 07/11/2015. Pap smear not indicated per B[facility]CP guidelines.   Smoking History: Patient has never smoked but has a history of smoking marijuana. Patient stated she quit smoking marijuana.  Patient Navigation: Patient education provided. Access to services provided for patient through [facility].

## 2015-08-29 NOTE — Patient Instructions (Addendum)
Educational materials on self breast awareness given. Explained to Ashley [name] the colposcopy the needed follow up for her abnormal Pap smear on 07/11/2015. [facility] for a colposocopy to follow up for abnormal Pap smear. Appointment scheduled for Monday, September 29, 2015 at 1320. Patient aware of appointment and will be there. [facility] mamm[facility]. Appointment scheduled for Friday, August 29, 2015 at 1410. Let patient know the Breast Center will follow up with her within the next couple of weeks with results of mammogram by letter or phone. Ashley [name] verbalized understanding.  Chrisie Jankovich, Kathaleen Maserhristine Poll, RN 2:02 PM

## 2015-08-29 NOTE — Addendum Note (Signed)
Encounter addended by: Priscille Heidelberghristine P Foy Mungia, RN on: 08/29/2015  2:20 PM<BR>     Documentation filed: Notes Section

## 2015-09-01 ENCOUNTER — Encounter (HOSPITAL_COMMUNITY): Payer: Self-pay | Admitting: *Deleted

## 2015-09-05 ENCOUNTER — Encounter: Payer: Self-pay | Admitting: *Deleted

## 2015-09-21 ENCOUNTER — Encounter (HOSPITAL_COMMUNITY): Payer: Self-pay | Admitting: Emergency Medicine

## 2015-09-21 ENCOUNTER — Ambulatory Visit (HOSPITAL_COMMUNITY)
Admission: EM | Admit: 2015-09-21 | Discharge: 2015-09-21 | Disposition: A | Discharge status: Home / Self Care | Payer: No Typology Code available for payment source | Attending: Physician Assistant | Admitting: Physician Assistant

## 2015-09-21 DIAGNOSIS — R1031 Right lower quadrant pain: Secondary | ICD-10-CM

## 2015-09-21 LAB — POCT PREGNANCY, URINE: PREG TEST UR: NEGATIVE

## 2015-09-21 LAB — POCT URINALYSIS DIP (DEVICE)
BILIRUBIN URINE: NEGATIVE
Glucose, UA: NEGATIVE mg/dL
Hgb urine dipstick: NEGATIVE
Ketones, ur: NEGATIVE mg/dL
Leukocytes, UA: NEGATIVE
Nitrite: NEGATIVE
Protein, ur: NEGATIVE mg/dL
SPECIFIC GRAVITY, URINE: 1.01 (ref 1.005–1.030)
Urobilinogen, UA: 0.2 mg/dL (ref 0.0–1.0)
pH: 7.5 (ref 5.0–8.0)

## 2015-09-21 NOTE — ED Provider Notes (Signed)
CSN: [id number]     Arrival date & time 09/21/15  1205 History   None    Chief Complaint  Patient presents with  . Abdominal Pain    swelling  . Heartburn   (Consider location/radiation/quality/duration/timing/severity/associated sxs/prior Treatment)  Abdominal Pain  Heartburn  Associated symptoms include abdominal pain.   History obtained from patient:  Pt presents with the cc of:  Right lower quadrant abdominal swelling Duration of symptoms: 3-4 weeks Treatment prior to arrival: No treatment Context: Patient is unsure about how or when it all started but she feels that there is a swelling in her abdomen. It is not causing any vomiting or diarrhea at this time. Patient also states that she had intercourse approximately 6 weeks ago had some concern about pregnancy at this time. Other symptoms include: Heartburn has just recently started. Past Medical History:  Diagnosis Date  . Anxiety   . ASCUS favor benign 04/2013   Negative high risk HPV screen. Recommend repeat Pap smear/HPV one year  . Endometrial polyp 04/2013   Removed in the office extruding from the cervical os  . Migraines    Past Surgical History:  Procedure Laterality Date  . DILATATION & CURETTAGE/HYSTEROSCOPY WITH TRUECLEAR N/A 09/18/2013   Procedure: DILATATION & CURETTAGE/HYSTEROSCOPY WITH TRUCLEAR;  Surgeon: Dara Lords, MD;  Location: [facility];  Service: Gynecology;  Laterality: N/A;  . WISDOM TOOTH EXTRACTION Bilateral    Family History  Problem Relation Age of Onset  . Bipolar disorder Mother   . Mental illness Mother   . Parkinsonism Father   . Hypertension Father   . Cancer Paternal Grandmother     Breast   Social History  Substance Use Topics  . Smoking status: Never Smoker  . Smokeless tobacco: Not on file  . Alcohol use Yes     Comment: occ   OB History    Gravida Para Term Preterm AB Living   0             SAB TAB Ectopic Multiple Live Births                 Review of Systems   Gastrointestinal: Positive for abdominal pain and heartburn.    Denies: HEADACHE, NAUSEA,   CHEST PAIN, CONGESTION, DYSURIA, SHORTNESS OF BREATH  Allergies  Codeine  Home Medications   Prior to Admission medications   Medication Sig Start Date End Date Taking? Authorizing Provider  amoxicillin (AMOXIL) 500 MG capsule Take 1 capsule (500 mg total) by mouth 2 (two) times daily. Patient not taking: Reported on 10/09/2014 08/08/14   Ozella Rocks, MD  Calcium-Magnesium 250-125 MG TABS Take 1 tablet by mouth daily. Reported on 08/29/2015    Historical Provider, MD  fluconazole (DIFLUCAN) 150 MG tablet Take 1 tablet (150 mg total) by mouth daily. Repeat dose in 3 days Patient not taking: Reported on 10/09/2014 08/08/14   Ozella Rocks, MD  fluticasone [facility]) 50 MCG/ACT nasal spray Place 2 sprays into both nostrils daily. Patient not taking: Reported on 10/09/2014 08/23/14   Rodolph Bong, MD  GARLIC PO Take 409 mg by mouth daily. Reported on 08/29/2015    Historical Provider, MD  Multiple Vitamins-Minerals (MULTIVITAMIN WITH MINERALS) tablet Take 1 tablet by mouth daily. Reported on 08/29/2015    Historical Provider, MD  predniSONE (DELTASONE) 10 MG tablet Take 3 tablets (30 mg total) by mouth daily. Patient not taking: Reported on 10/09/2014 08/23/14   Rodolph Bong, MD  TURMERIC PO Take  400 mg by mouth daily. Reported on 08/29/2015    Historical Provider, MD   Meds Ordered and Administered this Visit  Medications - No data to display  BP 100/60 (BP Location: Right Arm)   Pulse 81   Temp 97.5 F (36.4 C) (Oral)   Resp 16   Ht 5' 6.5" (1.689 m)   Wt 125 lb (56.7 kg)   LMP 08/01/2014 (Exact Date)   SpO2 100%   BMI 19.87 kg/m  No data found.   Physical Exam NURSES NOTES AND VITAL SIGNS REVIEWED. CONSTITUTIONAL: Well developed, well nourished, no acute distress HEENT: normocephalic, atraumatic EYES: Conjunctiva normal NECK:normal ROM, supple, no adenopathy PULMONARY:No respiratory  distress, normal effort ABDOMINAL: Soft, ND, NT BS+, No CVAT (examination is performed with female chaperone) MUSCULOSKELETAL: Normal ROM of all extremities,  SKIN: warm and dry without rash PSYCHIATRIC: Mood and affect, behavior are normal  Urgent Care Course   Clinical Course    Procedures (including critical care time)  Labs Review Labs Reviewed - No data to display  Imaging Review No results found.   Visual Acuity Review  Right Eye Distance:   Left Eye Distance:   Bilateral Distance:    Right Eye Near:   Left Eye Near:    Bilateral Near:        Patient is advised to follow-up with her primary care provider or equal gastroenterology. I have discussed with patient and her sister that I have found no pathology that has emergent concern at this time. Patient should continue symptomatic care. MDM   1. Right lower quadrant abdominal pain     Patient is reassured that there are no issues that require transfer to higher level of care at this time or additional tests. Patient is advised to continue home symptomatic treatment. Patient is advised that if there are new or worsening symptoms to attend the emergency department, contact primary care provider, or return to UC. Instructions of care provided discharged home in stable condition.    THIS NOTE WAS GENERATED USING A VOICE RECOGNITION SOFTWARE PROGRAM. ALL REASONABLE EFFORTS  WERE MADE TO PROOFREAD THIS DOCUMENT FOR ACCURACY.  I have verbally reviewed the discharge instructions with the patient. A printed AVS was given to the patient.  All questions were answered prior to discharge.      Tharon Aquas, PA 09/21/15 1545

## 2015-09-21 NOTE — Discharge Instructions (Signed)
DIET AND ACTIVITY AS TOLERATED.

## 2015-09-21 NOTE — ED Triage Notes (Signed)
Pt. Stated, Ashley [name] had abdominal swelling and some constipation for about 3 weeks.   I've also had some heartburn.

## 2015-09-29 ENCOUNTER — Encounter: Payer: Self-pay | Admitting: Obstetrics and Gynecology

## 2015-09-29 ENCOUNTER — Ambulatory Visit (INDEPENDENT_AMBULATORY_CARE_PROVIDER_SITE_OTHER): Payer: Self-pay | Admitting: Obstetrics and Gynecology

## 2015-09-29 VITALS — BP 116/52 | HR 82 | Ht 66.5 in | Wt 124.0 lb

## 2015-09-29 DIAGNOSIS — N870 Mild cervical dysplasia: Secondary | ICD-10-CM | POA: Insufficient documentation

## 2015-09-29 HISTORY — DX: Mild cervical dysplasia: N87.0

## 2015-09-29 LAB — POCT PREGNANCY, URINE: PREG TEST UR: NEGATIVE

## 2015-09-29 NOTE — Procedures (Addendum)
Colposcopy Procedure Note  Pre-operative Diagnosis: LSIL, HPV testing not done 2016: negative pap and HPV neg 2015: ASCUS/HPV neg Patient denies any prior pap smears that were abnormal prior to 2015 and she was told that it was due to the polyp/fibroid that was removed  Post-operative Diagnosis: CIN 1  Procedure Details  The patient also denies any tobacco use or 2nd hand smoke exposure except when she's around her mother for the latter. UPT negative.   The risks (including infection, bleeding, pain) and benefits of the procedure were explained to the patient and written informed consent was obtained.  The patient was placed in the dorsal lithotomy position. A Graves speculum was inserted in the vagina, and the cervix was visualized.  AA staining done and Lugol's with green filter for both, with the below noted findings.  Findings: On AA, circumferentially AWE changes seen approximately 57mm around the cervix and confirmatory on Lugol's.  No other changes  Adequate: Yes  Specimens: None  Condition: Stable  Complications: None  Plan: The patient was advised to call for any fever or for prolonged or severe pain or bleeding. She was advised to use OTC analgesics as needed for mild to moderate pain. She was advised to avoid vaginal intercourse for 48 hours or until the bleeding has completely stopped.  No biopsy or ECC done given adequate colpo and findings c/w pap smear/CIN1. Given this, patient told to repeat pap smear in May/June of next year with [facility] and told importance of close follow up and minimizing exposure to 2nd hand smoke.   Cornelia Copa MD Attending Center for [employer] [facility])

## 2016-03-31 ENCOUNTER — Ambulatory Visit (HOSPITAL_COMMUNITY)
Admission: EM | Admit: 2016-03-31 | Discharge: 2016-03-31 | Disposition: A | Discharge status: Home / Self Care | Payer: Self-pay | Attending: Emergency Medicine | Admitting: Emergency Medicine

## 2016-03-31 ENCOUNTER — Encounter (HOSPITAL_COMMUNITY): Payer: Self-pay | Admitting: Emergency Medicine

## 2016-03-31 DIAGNOSIS — M79642 Pain in left hand: Secondary | ICD-10-CM

## 2016-03-31 DIAGNOSIS — M79641 Pain in right hand: Secondary | ICD-10-CM

## 2016-03-31 MED ORDER — PREDNISONE 10 MG PO TABS: ORAL_TABLET | ORAL | 0 refills | Status: DC

## 2016-03-31 NOTE — ED Triage Notes (Signed)
The [facility] with a complaint of redness and swelling to multiple fingers on both hands for 3 weeks.

## 2016-03-31 NOTE — ED Provider Notes (Signed)
CSN: [id number]     Arrival date & time 03/31/16  1008 History   None    Chief Complaint  Patient presents with  . Hand Pain   (Consider location/radiation/quality/duration/timing/severity/associated sxs/prior Treatment) 46 year old female presents to clinic with chief complaint of pain, swelling, and stiffness in both hands for three weeks, worse when they are cold. She states she has had a similar set of symptoms in the past and has been treated with steroids. She is requesting a prescription for prednisone today. She has not had prior work up for a diagnoses of her symptoms.   The history is provided by the patient.  Hand Pain     Past Medical History:  Diagnosis Date  . Anxiety   . ASCUS favor benign 04/2013   Negative high risk HPV screen. Recommend repeat Pap smear/HPV one year  . 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 Endometrial polyp 04/2013   Removed in the office extruding from the cervical os  . Migraines    Past Surgical History:  Procedure Laterality Date  . DILATATION & CURETTAGE/HYSTEROSCOPY WITH TRUECLEAR N/A 09/18/2013   Procedure: DILATATION & CURETTAGE/HYSTEROSCOPY WITH TRUCLEAR;  Surgeon: Dara Lordsimothy P Fontaine, MD;  Location: [facility];  Service: Gynecology;  Laterality: N/A;  . WISDOM TOOTH EXTRACTION Bilateral    Family History  Problem Relation Age of Onset  . Bipolar disorder Mother   . Mental illness Mother   . Parkinsonism Father   . Hypertension Father   . Cancer Paternal Grandmother     Breast   Social History  Substance Use Topics  . Smoking status: Never Smoker  . Smokeless tobacco: Not on file  . Alcohol use Yes     Comment: occ   OB History    Gravida Para Term Preterm AB Living   0             SAB TAB Ectopic Multiple Live Births                 Review of Systems  Reason unable to perform ROS: as covered in HPI.  All other systems reviewed and are negative.   Allergies  Codeine  Home Medications   Prior to  Admission medications   Medication Sig Start Date End Date Taking? Authorizing Provider  amoxicillin (AMOXIL) 500 MG capsule Take 1 capsule (500 mg total) by mouth 2 (two) times daily. Patient not taking: Reported on 10/09/2014 08/08/14   Ozella Rocksavid J Merrell, MD  predniSONE (DELTASONE) 10 MG tablet Take 2 tablets twice a day for 6 days. 03/31/16   Dorena BodoLawrence Jonita Hirota, NP   Meds Ordered and Administered this Visit  Medications - No data to display  BP (!) 102/53 (BP Location: Right Arm)   Pulse 70   Temp 97.8 F (36.6 C) (Oral)   Resp 16   LMP 08/01/2014 (Exact Date)   SpO2 100%  No data found.   Physical Exam  Constitutional: She is oriented to person, place, and time. She appears well-developed and well-nourished. No distress.  Cardiovascular: Normal rate and regular rhythm.   Pulmonary/Chest: Effort normal and breath sounds normal.  Musculoskeletal:  Reddened, swelling noted in all her finger, no decreased grip strengths, no pain with palpation or movement, no nodules present  Neurological: She is alert and oriented to person, place, and time.  Skin: Skin is warm and dry. Capillary refill takes less than 2 seconds. She is not diaphoretic.  Psychiatric: She has a normal mood and  affect.  Nursing note and vitals reviewed.   Urgent Care Course     Procedures (including critical care time)  Labs Review Labs Reviewed - No data to display  Imaging Review No results found.   Visual Acuity Review  Right Eye Distance:   Left Eye Distance:   Bilateral Distance:    Right Eye Near:   Left Eye Near:    Bilateral Near:         MDM   1. Pain in both hands    For your hand pain there could be numerous different causes for the symptoms you are experiencing such as osteoarthritis, rheumatoid arthritis, Reynolds phenomenon, and many more.  All these diagnosis will require more work up than what can be done here in the urgent care today so I would recommend following up with a primary  care provider who can do the necessary tests and imagining to help you. In the mean time I have prescribed a steroid, take two tablets twice a day for 6 days. Keep your hands warm, wear gloves when it is cold outside, and apply lotion to your skin.       Dorena Bodo, NP 03/31/16 1209

## 2016-03-31 NOTE — Discharge Instructions (Signed)
For your hand pain there could be numerous different causes for the symptoms you are experiencing such as osteoarthritis, rheumatoid arthritis, Reynolds phenomenon, and many more.  All these diagnosis will require more work up than what can be done here in the urgent care today so I would recommend following up with a primary care provider who can do the necessary tests and imagining to help you. In the mean time I have prescribed a steroid, take two tablets twice a day for 6 days. Keep your hands warm, wear gloves when it is cold outside, and apply lotion to your skin.

## 2016-10-06 ENCOUNTER — Telehealth (HOSPITAL_COMMUNITY): Payer: Self-pay | Admitting: *Deleted

## 2016-10-06 NOTE — Telephone Encounter (Signed)
Error

## 2016-11-20 ENCOUNTER — Emergency Department (HOSPITAL_COMMUNITY)
Admission: EM | Admit: 2016-11-20 | Discharge: 2016-11-20 | Disposition: A | Discharge status: Home / Self Care | Payer: Self-pay | Attending: Emergency Medicine | Admitting: Emergency Medicine

## 2016-11-20 ENCOUNTER — Ambulatory Visit (HOSPITAL_COMMUNITY)
Admission: EM | Admit: 2016-11-20 | Discharge: 2016-11-20 | Disposition: A | Discharge status: Home / Self Care | Payer: Self-pay | Attending: Family | Admitting: Family

## 2016-11-20 ENCOUNTER — Encounter (HOSPITAL_COMMUNITY): Payer: Self-pay | Admitting: Family Medicine

## 2016-11-20 ENCOUNTER — Emergency Department (HOSPITAL_COMMUNITY): Payer: Self-pay

## 2016-11-20 ENCOUNTER — Encounter (HOSPITAL_COMMUNITY): Payer: Self-pay

## 2016-11-20 DIAGNOSIS — R42 Dizziness and giddiness: Secondary | ICD-10-CM | POA: Insufficient documentation

## 2016-11-20 LAB — URINALYSIS, ROUTINE W REFLEX MICROSCOPIC
Bilirubin Urine: NEGATIVE
Glucose, UA: NEGATIVE mg/dL
Hgb urine dipstick: NEGATIVE
Ketones, ur: NEGATIVE mg/dL
LEUKOCYTES UA: NEGATIVE
NITRITE: NEGATIVE
PROTEIN: NEGATIVE mg/dL
SPECIFIC GRAVITY, URINE: 1.002 — AB (ref 1.005–1.030)
pH: 8 (ref 5.0–8.0)

## 2016-11-20 LAB — BASIC METABOLIC PANEL
Anion gap: 9 (ref 5–15)
BUN: 11 mg/dL (ref 6–20)
CO2: 24 mmol/L (ref 22–32)
CREATININE: 0.74 mg/dL (ref 0.44–1.00)
Calcium: 9.4 mg/dL (ref 8.9–10.3)
Chloride: 100 mmol/L — ABNORMAL LOW (ref 101–111)
GFR calc Af Amer: >60 mL/min (ref >60)
GFR calc non Af Amer: >60 mL/min (ref >60)
Glucose, Bld: 114 mg/dL — ABNORMAL HIGH (ref 65–99)
Potassium: 3.8 mmol/L (ref 3.5–5.1)
SODIUM: 133 mmol/L — AB (ref 135–145)

## 2016-11-20 LAB — CBC
HEMATOCRIT: 41.5 % (ref 36.0–46.0)
HEMOGLOBIN: 13.9 g/dL (ref 12.0–15.0)
MCH: 31.7 pg (ref 26.0–34.0)
MCHC: 33.5 g/dL (ref 30.0–36.0)
MCV: 94.7 fL (ref 78.0–100.0)
PLATELETS: 232 10*3/uL (ref 150–400)
RBC: 4.38 MIL/uL (ref 3.87–5.11)
RDW: 12.3 % (ref 11.5–15.5)
WBC: 6.7 10*3/uL (ref 4.0–10.5)

## 2016-11-20 LAB — POC URINE PREG, ED: PREG TEST UR: NEGATIVE

## 2016-11-20 NOTE — Discharge Instructions (Signed)
Drink plenty of fluids. Take exedrine migraine for headache. Avoid stressors. Follow up with family doctor or neurology.

## 2016-11-20 NOTE — ED Notes (Signed)
Family ok to go back to room-no answer in lobby

## 2016-11-20 NOTE — ED Provider Notes (Signed)
[facility]    CSN: [id number] Arrival date & time: 11/20/16  1211     History   Chief Complaint Chief Complaint  Patient presents with  . Dizziness    HPI Ashley [name] is a 46 y.o. female.   Chief complaint dizziness 6 days, worsening. Comes and goes all week. This morning was worse. Feeling nauseated, 'low grade headache.' rates 2/10.  No changes in vision, sppech.  No vertigo, sinus congestion, diarrhea, hearing changes. Feeling anxious.   Drinking plenty of water.  Feels better when laying down.  Worse when gets up too fast.  No h/o DM, HTN, CVA.        Past Medical History:  Diagnosis Date  . Anxiety   . ASCUS favor benign 04/2013   Negative high risk HPV screen. Recommend repeat Pap smear/HPV one year  . 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 Endometrial polyp 04/2013   Removed in the office extruding from the cervical os  . Migraines     Patient Active Problem List   Diagnosis Date Noted  . Dysplasia of cervix, low grade (CIN 1) 09/29/2015    Past Surgical History:  Procedure Laterality Date  . DILATATION & CURETTAGE/HYSTEROSCOPY WITH TRUECLEAR N/A 09/18/2013   Procedure: DILATATION & CURETTAGE/HYSTEROSCOPY WITH TRUCLEAR;  Surgeon: Dara Lords, MD;  Location: [facility];  Service: Gynecology;  Laterality: N/A;  . WISDOM TOOTH EXTRACTION Bilateral     OB History    Gravida Para Term Preterm AB Living   0             SAB TAB Ectopic Multiple Live Births                   Home Medications    Prior to Admission medications   Medication Sig Start Date End Date Taking? Authorizing Provider  amoxicillin (AMOXIL) 500 MG capsule Take 1 capsule (500 mg total) by mouth 2 (two) times daily. Patient not taking: Reported on 10/09/2014 08/08/14   Ozella Rocks, MD  predniSONE (DELTASONE) 10 MG tablet Take 2 tablets twice a day for 6 days. 03/31/16   Dorena Bodo, NP    Family History Family History  Problem  Relation Age of Onset  . Bipolar disorder Mother   . Mental illness Mother   . Parkinsonism Father   . Hypertension Father   . Cancer Paternal Grandmother        Breast    Social History Social History  Substance Use Topics  . Smoking status: Never Smoker  . Smokeless tobacco: Never Used  . Alcohol use Yes     Comment: occ     Allergies   Codeine   Review of Systems Review of Systems  Constitutional: Negative for chills and fever.  HENT: Negative for congestion, hearing loss and sinus pressure.   Eyes: Negative for visual disturbance.  Respiratory: Negative for cough.   Cardiovascular: Negative for chest pain and palpitations.  Gastrointestinal: Negative for abdominal pain, nausea and vomiting.  Genitourinary: Negative for dysuria.  Neurological: Positive for dizziness and headaches. Negative for syncope, weakness and numbness.  Psychiatric/Behavioral: Negative for confusion.     Physical Exam Triage Vital Signs ED Triage Vitals [11/20/16 1306]  Enc Vitals Group     BP 133/62     Pulse Rate 82     Resp 18     Temp 98 F (36.7 C)     Temp src      SpO2  97 %     Weight      Height      Head Circumference      Peak Flow      Pain Score      Pain Loc      Pain Edu?      Excl. in GC?    No data found.   Updated Vital Signs BP 133/62   Pulse 82   Temp 98 F (36.7 C)   Resp 18   LMP 08/01/2014 (Exact Date)   SpO2 97%   Visual Acuity Right Eye Distance:   Left Eye Distance:   Bilateral Distance:    Right Eye Near:   Left Eye Near:    Bilateral Near:     Physical Exam  Constitutional: She appears well-developed and well-nourished.  HENT:  Mouth/Throat: Uvula is midline, oropharynx is clear and moist and mucous membranes are normal.  Eyes: Pupils are equal, round, and reactive to light. Conjunctivae and EOM are normal.  Fundus normal bilaterally.   Cardiovascular: Normal rate, regular rhythm, normal heart sounds and normal pulses.     Pulmonary/Chest: Effort normal and breath sounds normal. She has no wheezes. She has no rhonchi. She has no rales.  Neurological: She is alert. She has normal strength. No cranial nerve deficit or sensory deficit. She displays a negative Romberg sign.  Reflex Scores:      Bicep reflexes are 2+ on the right side and 2+ on the left side.      Patellar reflexes are 2+ on the right side and 2+ on the left side. Grip equal and strong bilateral upper extremities. Gait strong and steady. Able to perform rapid alternating movement without difficulty.   Skin: Skin is warm and dry.  Psychiatric: She has a normal mood and affect. Her speech is normal and behavior is normal. Thought content normal.  Vitals reviewed.    UC Treatments / Results  Labs (all labs ordered are listed, but only abnormal results are displayed) Labs Reviewed - No data to display  EKG  EKG Interpretation None       Radiology No results found.  Procedures Procedures (including critical care time)  Medications Ordered in UC Medications - No data to display   Initial Impression / Assessment and Plan / UC Course  I have reviewed the triage vital signs and the nursing notes.  Pertinent labs & imaging results that were available during my care of the patient were reviewed by me and considered in my medical decision making (see chart for details).       Final Clinical Impressions(s) / UC Diagnoses   Final diagnoses:  Dizziness   Etiology of dizziness unknown at this time. Reassured by normal neurologic exam. While awaiting on orthostatic blood pressures to be done by nurse. Patient walked out of room and stated she was very concerned and would like to have imaging of her head and go to the emergency room. Advised her this was appropriate to do so. She declined transportation. Patient was walked by nurse, Kennyth Arnold, to [facility] emergency room. New Prescriptions Discharge Medication List as of 11/20/2016  1:48 PM        Controlled Substance Prescriptions [place] Controlled Substance Registry consulted? Not Applicable   Allegra Grana, FNP 11/20/16 1436

## 2016-11-20 NOTE — ED Triage Notes (Signed)
Onset last week lightheaded.  Pt anxious today.  No other s/s noted.

## 2016-11-20 NOTE — ED Triage Notes (Signed)
Pt here for worsening dizziness since Monday. sts that she feels lightheaded. Denies any recent sickness. Pt appears anxious. sts slight headache.

## 2016-11-20 NOTE — Discharge Instructions (Signed)
Patient left prior to [facility]S for ED

## 2016-11-20 NOTE — ED Provider Notes (Signed)
[facility]   CSN: [id number] Arrival date & time: 11/20/16  1349     History   Chief Complaint Chief Complaint  Patient presents with  . Dizziness    HPI Ashley [name] is a 46 y.o. female.  HPI  Ashley [name] is a 46 y.o. female with history of anxiety, migraine headaches, presents to emergency department complaining of dizziness. Patient states "my head doesn't feel right, feels swimmy headed." She reports symptoms for about a week. She states initially symptoms were, and go, but reports that her symptoms have been constant since this morning. Her dizziness is not worsened with positional changes. It is not worse with walking. She describes sensation of "swimmy headed" denies any sensation of room spinning or feeling like she might faint. She reports very mild, 2 out of 10 headache. Denies any visual changes. She does report anxiety. She states that her sister made her go to the doctor, and she was seen at urgent care. There she was told that she had normal neurological exam, however patient states that she may have had a panic attack while there, states had trouble finding words, and requested imaging of the brain, and she was sent here. Patient states that her word finding problems has resolved. She denies any other associated symptoms.    Past Medical History:  Diagnosis Date  . Anxiety   . ASCUS favor benign 04/2013   Negative high risk HPV screen. Recommend repeat Pap smear/HPV one year  . 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 Endometrial polyp 04/2013   Removed in the office extruding from the cervical os  . Migraines     Patient Active Problem List   Diagnosis Date Noted  . Dysplasia of cervix, low grade (CIN 1) 09/29/2015    Past Surgical History:  Procedure Laterality Date  . DILATATION & CURETTAGE/HYSTEROSCOPY WITH TRUECLEAR N/A 09/18/2013   Procedure: DILATATION & CURETTAGE/HYSTEROSCOPY WITH TRUCLEAR;  Surgeon: Dara Lords, MD;  Location: [facility];  Service: Gynecology;  Laterality: N/A;  . WISDOM TOOTH EXTRACTION Bilateral     OB History    Gravida Para Term Preterm AB Living   0             SAB TAB Ectopic Multiple Live Births                   Home Medications    Prior to Admission medications   Medication Sig Start Date End Date Taking? Authorizing Provider  amoxicillin (AMOXIL) 500 MG capsule Take 1 capsule (500 mg total) by mouth 2 (two) times daily. Patient not taking: Reported on 10/09/2014 08/08/14   Ozella Rocks, MD  predniSONE (DELTASONE) 10 MG tablet Take 2 tablets twice a day for 6 days. 03/31/16   Dorena Bodo, NP    Family History Family History  Problem Relation Age of Onset  . Bipolar disorder Mother   . Mental illness Mother   . Parkinsonism Father   . Hypertension Father   . Cancer Paternal Grandmother        Breast    Social History Social History  Substance Use Topics  . Smoking status: Never Smoker  . Smokeless tobacco: Never Used  . Alcohol use Yes     Comment: occ     Allergies   Codeine   Review of Systems Review of Systems  Constitutional: Negative for chills and fever.  Respiratory: Negative for cough, chest tightness and shortness of  breath.   Cardiovascular: Negative for chest pain, palpitations and leg swelling.  Gastrointestinal: Negative for abdominal pain, diarrhea, nausea and vomiting.  Genitourinary: Negative for dysuria, flank pain, pelvic pain, vaginal bleeding, vaginal discharge and vaginal pain.  Musculoskeletal: Negative for arthralgias, myalgias, neck pain and neck stiffness.  Skin: Negative for rash.  Neurological: Positive for dizziness and headaches. Negative for weakness.  Psychiatric/Behavioral: The patient is nervous/anxious.   All other systems reviewed and are negative.    Physical Exam Updated Vital Signs BP (!) 129/51 (BP Location: Right Arm)   Pulse 85   Temp (!) 97.4 F (36.3 C) (Oral)   Resp 17   LMP  08/01/2014 (Exact Date)   SpO2 100%   Physical Exam  Constitutional: She is oriented to person, place, and time. She appears well-developed and well-nourished. No distress.  HENT:  Head: Normocephalic.  Eyes: Conjunctivae are normal.  Neck: Neck supple.  Cardiovascular: Normal rate, regular rhythm and normal heart sounds.   Pulmonary/Chest: Effort normal and breath sounds normal. No respiratory distress. She has no wheezes. She has no rales.  Abdominal: Soft. Bowel sounds are normal. She exhibits no distension. There is no tenderness. There is no rebound.  Musculoskeletal: She exhibits no edema.  Neurological: She is alert and oriented to person, place, and time.  5/5 and equal upper and lower extremity strength bilaterally. Equal grip strength bilaterally. Normal finger to nose and heel to shin. No pronator drift. Patellar reflexes 2+ Patient appears anxious, she is avoiding eye contact, her speech is somewhat stuttering.   Skin: Skin is warm and dry.  Psychiatric: She has a normal mood and affect. Her behavior is normal.  Nursing note and vitals reviewed.    ED Treatments / Results  Labs (all labs ordered are listed, but only abnormal results are displayed) Labs Reviewed  BASIC METABOLIC PANEL - Abnormal; Notable for the following:       Result Value   Sodium 133 (*)    Chloride 100 (*)    Glucose, Bld 114 (*)    All other components within normal limits  URINALYSIS, ROUTINE W REFLEX MICROSCOPIC - Abnormal; Notable for the following:    Color, Urine COLORLESS (*)    Specific Gravity, Urine 1.002 (*)    All other components within normal limits  CBC  POC URINE PREG, ED    EKG  EKG Interpretation  Date/Time:  Saturday November 20 2016 19:03:43 EDT Ventricular Rate:  77 PR Interval:    QRS Duration: 107 QT Interval:  373 QTC Calculation: 423 R Axis:   96 Text Interpretation:  Sinus rhythm Right atrial enlargement Borderline right axis deviation No significant change  since last tracing Confirmed by Shaune Pollack [phone]) on 11/20/2016 8:55:10 PM       Radiology Ct Head Wo Contrast  Result Date: 11/20/2016 CLINICAL DATA:  Worsening dizziness for 6 days.  Slight headache. EXAM: CT HEAD WITHOUT CONTRAST TECHNIQUE: Contiguous axial images were obtained from the base of the skull through the vertex without intravenous contrast. COMPARISON:  None. FINDINGS: BRAIN: No intraparenchymal hemorrhage, mass effect nor midline shift. The ventricles and sulci are normal. No acute large vascular territory infarcts. No abnormal extra-axial fluid collections. Basal cisterns are patent. VASCULAR: Unremarkable. SKULL/SOFT TISSUES: No skull fracture. Mild LEFT temporomandibular osteoarthrosis. No significant soft tissue swelling. ORBITS/SINUSES: The included ocular globes and orbital contents are normal.RIGHT maxillary mucosal retention cysts without paranasal sinus air-fluid levels. Mastoid air cells are well aerated. OTHER: None. IMPRESSION: Normal noncontrast CT  HEAD. Electronically Signed   By: Awilda Metro M.D.   On: 11/20/2016 19:48    Procedures Procedures (including critical care time)  Medications Ordered in ED Medications - No data to display   Initial Impression / Assessment and Plan / ED Course  I have reviewed the triage vital signs and the nursing notes.  Pertinent labs & imaging results that were available during my care of the patient were reviewed by me and considered in my medical decision making (see chart for details).     Patient in emergency department with dizziness for a week. She has no other family with her, she appears to be very anxious, she stuttering, avoiding eye contact. I am unsure of her baseline, and maybe would like to speak with her family. She states they will be on the way here. She has normal neurological exam otherwise. I will get labs, orthostatic vital signs, EKG, will get CT head.  9:01 PM Pt's labs and electrolytes as well  as CT negative. Not orthostatic. ecg showing sinus rhythm. No focal findings on exam. Pt's twin sister in the room. Very similar speech and demeanor. States sister acting as her normal self. Based on assessment, I do not think she needs further testing on emergent basis. Will follow up with pcp and neurology. Return precautions discussed.   Vitals:   11/20/16 1409 11/20/16 1649 11/20/16 1906  BP: 127/77 (!) 129/51 103/69  Pulse: 99 85   Resp: Temp: (!) 97.4 F (36.3 C)    TempSrc: Oral    SpO2: 100% 100%      Final Clinical Impressions(s) / ED Diagnoses   Final diagnoses:  Dizziness    New Prescriptions New Prescriptions   No medications on file     Iona Coach 11/20/16 2103    Shaune Pollack, MD 11/21/16 [phone]

## 2016-11-21 ENCOUNTER — Encounter (HOSPITAL_COMMUNITY): Payer: Self-pay | Admitting: Emergency Medicine

## 2016-11-21 ENCOUNTER — Emergency Department (HOSPITAL_COMMUNITY)
Admission: EM | Admit: 2016-11-21 | Discharge: 2016-11-21 | Disposition: A | Discharge status: Home / Self Care | Payer: Self-pay | Attending: Emergency Medicine | Admitting: Emergency Medicine

## 2016-11-21 DIAGNOSIS — Z79899 Other long term (current) drug therapy: Secondary | ICD-10-CM | POA: Insufficient documentation

## 2016-11-21 DIAGNOSIS — R42 Dizziness and giddiness: Secondary | ICD-10-CM | POA: Insufficient documentation

## 2016-11-21 DIAGNOSIS — E871 Hypo-osmolality and hyponatremia: Secondary | ICD-10-CM | POA: Insufficient documentation

## 2016-11-21 LAB — COMPREHENSIVE METABOLIC PANEL
ALBUMIN: 4.2 g/dL (ref 3.5–5.0)
ALK PHOS: 73 U/L (ref 38–126)
ALT: 27 U/L (ref 14–54)
AST: 30 U/L (ref 15–41)
Anion gap: 8 (ref 5–15)
BILIRUBIN TOTAL: 0.9 mg/dL (ref 0.3–1.2)
BUN: 11 mg/dL (ref 6–20)
CALCIUM: 9.1 mg/dL (ref 8.9–10.3)
CO2: 25 mmol/L (ref 22–32)
CREATININE: 0.75 mg/dL (ref 0.44–1.00)
Chloride: 95 mmol/L — ABNORMAL LOW (ref 101–111)
GFR calc Af Amer: >60 mL/min (ref >60)
GFR calc non Af Amer: >60 mL/min (ref >60)
GLUCOSE: 93 mg/dL (ref 65–99)
Potassium: 4.1 mmol/L (ref 3.5–5.1)
Sodium: 128 mmol/L — ABNORMAL LOW (ref 135–145)
TOTAL PROTEIN: 6.7 g/dL (ref 6.5–8.1)

## 2016-11-21 LAB — CBC WITH DIFFERENTIAL/PLATELET
BASOS ABS: 0 10*3/uL (ref 0.0–0.1)
BASOS PCT: 1 %
EOS ABS: 0.1 10*3/uL (ref 0.0–0.7)
EOS PCT: 3 %
HCT: 36.3 % (ref 36.0–46.0)
Hemoglobin: 12.2 g/dL (ref 12.0–15.0)
Lymphocytes Relative: 38 %
Lymphs Abs: 1.9 10*3/uL (ref 0.7–4.0)
MCH: 31.3 pg (ref 26.0–34.0)
MCHC: 33.6 g/dL (ref 30.0–36.0)
MCV: 93.1 fL (ref 78.0–100.0)
Monocytes Absolute: 0.3 10*3/uL (ref 0.1–1.0)
Monocytes Relative: 6 %
NEUTROS PCT: 52 %
Neutro Abs: 2.5 10*3/uL (ref 1.7–7.7)
Platelets: 175 10*3/uL (ref 150–400)
RBC: 3.9 MIL/uL (ref 3.87–5.11)
RDW: 12.2 % (ref 11.5–15.5)
WBC: 4.8 10*3/uL (ref 4.0–10.5)

## 2016-11-21 LAB — I-STAT ARTERIAL BLOOD GAS, ED
Acid-Base Excess: 3 mmol/L — ABNORMAL HIGH (ref 0.0–2.0)
Bicarbonate: 25.8 mmol/L (ref 20.0–28.0)
O2 SAT: 100 %
PCO2 ART: 32.5 mmHg (ref 32.0–48.0)
PH ART: 7.506 — AB (ref 7.350–7.450)
PO2 ART: 162 mmHg — AB (ref 83.0–108.0)
Patient temperature: 97.8
TCO2: 27 mmol/L (ref 22–32)

## 2016-11-21 LAB — COOXEMETRY PANEL
Carboxyhemoglobin: 0.6 % (ref 0.5–1.5)
METHEMOGLOBIN: 1 % (ref 0.0–1.5)
O2 SAT: 47.3 %
TOTAL HEMOGLOBIN: 12.6 g/dL (ref 12.0–16.0)

## 2016-11-21 LAB — RAPID URINE DRUG SCREEN, HOSP PERFORMED
Amphetamines: NOT DETECTED
Barbiturates: NOT DETECTED
Benzodiazepines: NOT DETECTED
Cocaine: NOT DETECTED
Opiates: NOT DETECTED
Tetrahydrocannabinol: NOT DETECTED

## 2016-11-21 LAB — TSH: TSH: 1.687 u[IU]/mL (ref 0.350–4.500)

## 2016-11-21 MED ORDER — SODIUM CHLORIDE 0.9 % IV BOLUS (SEPSIS)
1000.0000 mL | Freq: Once | INTRAVENOUS | Status: AC
  Administered 2016-11-21: 1000 mL via INTRAVENOUS

## 2016-11-21 MED ORDER — LORAZEPAM 1 MG PO TABS
1.0000 mg | ORAL_TABLET | Freq: Once | ORAL | Status: AC
  Administered 2016-11-21: 1 mg via ORAL
  Filled 2016-11-21: qty 1

## 2016-11-21 NOTE — ED Notes (Signed)
Pt understood dc material. NAD noted. 

## 2016-11-21 NOTE — ED Provider Notes (Signed)
[facility]   CSN: [id number] Arrival date & time: 11/21/16  1823     History   Chief Complaint Chief Complaint  Patient presents with  . Dizziness    HPI Ashley [name] is a 46 y.o. female.  HPI Ashley [name] is a 46 y.o. female with history of anxiety, presents to emergency department complaining of dizziness. Patient states she was driving in the car running errands today, states she got out of the car to go to the store and started feeling dizzy. She called [employer] who came and evaluated patient and called EMS. Patient states that she may have panicked, because she felt like her car smelled gasoline. According to EMS, patient was hooked up to their gas monitor and her carbon monoxide level was 19. Patient also according to EMS had what looked like outline of some sort of device around her mouth. Patient denied anything. She stated that she was just running errands and her older Joaquim Nam, and states she was driving with her windows down today. She denies sitting in her car in the garage. She denies having any heating devices in her house at this time. She denies taking any drugs. States she has been feeling dizzy for about week and a half, and she was seen in emergency department for the same, had blood work and CT head performed. Patient states whenever they put oxygen on her she felt better. She reports associated tingling in her fingertips. Denies any headache. No nausea, vomiting. No other complaints.   Patient Active Problem List   Diagnosis Date Noted  . Dysplasia of cervix, low grade (CIN 1) 09/29/2015    Past Surgical History:  Procedure Laterality Date  . DILATATION & CURETTAGE/HYSTEROSCOPY WITH TRUECLEAR N/A 09/18/2013   Procedure: DILATATION & CURETTAGE/HYSTEROSCOPY WITH TRUCLEAR;  Surgeon: Dara Lords, MD;  Location: [facility];  Service: Gynecology;  Laterality: N/A;  . WISDOM TOOTH EXTRACTION Bilateral     OB History    Gravida Para Term  Preterm AB Living   0             SAB TAB Ectopic Multiple Live Births                   Home Medications    Prior to Admission medications   Medication Sig Start Date End Date Taking? Authorizing Provider  amoxicillin (AMOXIL) 500 MG capsule Take 1 capsule (500 mg total) by mouth 2 (two) times daily. Patient not taking: Reported on 10/09/2014 08/08/14   Ozella Rocks, MD  GARLIC PO Take 1 capsule by mouth daily.    [provider]  MAGNESIUM PO Take 1 tablet by mouth daily.    [provider]  Multiple Vitamins-Minerals (MULTIVITAMIN WITH MINERALS) tablet Take 1 tablet by mouth daily.    [provider]  predniSONE (DELTASONE) 10 MG tablet Take 2 tablets twice a day for 6 days. Patient not taking: Reported on 11/20/2016 03/31/16   Dorena Bodo, NP  TURMERIC PO Take 1 capsule by mouth daily.    [provider]    Family History Family History  Problem Relation Age of Onset  . Bipolar disorder Mother   . Mental illness Mother   . Parkinsonism Father   . Hypertension Father   . Cancer Paternal Grandmother        Breast    Social History Social History  Substance Use Topics  . Smoking status: Never Smoker  . Smokeless tobacco: Never Used  .  Alcohol use Yes     Comment: occ     Allergies   Codeine   Review of Systems Review of Systems  Constitutional: Negative for chills and fever.  Respiratory: Negative for cough, chest tightness and shortness of breath.   Cardiovascular: Negative for chest pain, palpitations and leg swelling.  Gastrointestinal: Negative for abdominal pain, diarrhea, nausea and vomiting.  Genitourinary: Negative for dysuria, flank pain, pelvic pain, vaginal bleeding, vaginal discharge and vaginal pain.  Musculoskeletal: Negative for arthralgias, myalgias, neck pain and neck stiffness.  Skin: Negative for rash.  Neurological: Positive for dizziness and light-headedness. Negative for weakness and headaches.    All other systems reviewed and are negative.    Physical Exam Updated Vital Signs BP 120/72 (BP Location: Right Arm)   Pulse 74   Temp 97.8 F (36.6 C) (Oral)   Resp 14   LMP 08/01/2014 (Exact Date)   SpO2 100%   Physical Exam  Constitutional: She is oriented to person, place, and time. She appears well-developed and well-nourished. No distress.  HENT:  Head: Normocephalic.  Eyes: Pupils are equal, round, and reactive to light. Conjunctivae and EOM are normal.  Neck: Neck supple.  Cardiovascular: Normal rate, regular rhythm and normal heart sounds.   Pulmonary/Chest: Effort normal and breath sounds normal. No respiratory distress. She has no wheezes. She has no rales.  Abdominal: Soft. Bowel sounds are normal. She exhibits no distension. There is no tenderness. There is no rebound.  Musculoskeletal: She exhibits no edema.  Neurological: She is alert and oriented to person, place, and time.  5/5 and equal upper and lower extremity strength bilaterally. Equal grip strength bilaterally. Normal finger to nose and heel to shin. No pronator drift. Patellar reflexes 2+   Skin: Skin is warm and dry.  Psychiatric:  Appears anxious. Avoiding eye contact.   Nursing note and vitals reviewed.    ED Treatments / Results  Labs (all labs ordered are listed, but only abnormal results are displayed) Labs Reviewed  COMPREHENSIVE METABOLIC PANEL - Abnormal; Notable for the following:       Result Value   Sodium 128 (*)    Chloride 95 (*)    All other components within normal limits  I-STAT ARTERIAL BLOOD GAS, ED - Abnormal; Notable for the following:    pH, Arterial 7.506 (*)    pO2, Arterial 162.0 (*)    Acid-Base Excess 3.0 (*)    All other components within normal limits  COOXEMETRY PANEL  CBC WITH DIFFERENTIAL/PLATELET  RAPID URINE DRUG SCREEN, HOSP PERFORMED  TSH    EKG  EKG Interpretation  Date/Time:  Sunday November 21 2016 18:53:02 EDT Ventricular Rate:  77 PR  Interval:    QRS Duration: 105 QT Interval:  382 QTC Calculation: 433 R Axis:   96 Text Interpretation:  Sinus rhythm Borderline right axis deviation ST elev, probable normal early repol pattern No significant change since last tracing Confirmed by Shaune Pollack [phone]) on 11/21/2016 7:28:40 PM       Radiology Ct Head Wo Contrast  Result Date: 11/20/2016 CLINICAL DATA:  Worsening dizziness for 6 days.  Slight headache. EXAM: CT HEAD WITHOUT CONTRAST TECHNIQUE: Contiguous axial images were obtained from the base of the skull through the vertex without intravenous contrast. COMPARISON:  None. FINDINGS: BRAIN: No intraparenchymal hemorrhage, mass effect nor midline shift. The ventricles and sulci are normal. No acute large vascular territory infarcts. No abnormal extra-axial fluid collections. Basal cisterns are patent. VASCULAR: Unremarkable. SKULL/SOFT TISSUES: No skull  fracture. Mild LEFT temporomandibular osteoarthrosis. No significant soft tissue swelling. ORBITS/SINUSES: The included ocular globes and orbital contents are normal.RIGHT maxillary mucosal retention cysts without paranasal sinus air-fluid levels. Mastoid air cells are well aerated. OTHER: None. IMPRESSION: Normal noncontrast CT HEAD. Electronically Signed   By: Awilda Metro M.D.   On: 11/20/2016 19:48    Procedures Procedures (including critical care time)  Medications Ordered in ED Medications - No data to display   Initial Impression / Assessment and Plan / ED Course  I have reviewed the triage vital signs and the nursing notes.  Pertinent labs & imaging results that were available during my care of the patient were reviewed by me and considered in my medical decision making (see chart for details).    patient with persistent dizziness, was just evaluated in improved department yesterday. Apparently possibly CO2 poisoning. Will check labs including carboxyhemoglobin levels. Patient is currently on oxygen. Has  normal vital signs.  Pt's sodium today is low. Otherwise unremarkable labs including carboxyhemoglobin. Discussed with Dr. Penne Lash, who has seen pt as well. Stable for dc home with close outpatient follow up. She has normal neurological exam. She appears very anxious for which she received ativan. Question panic attacks. I hydrated her in ED today. Advised to follow up with pcp. Return precautions discussed.   Vitals:   11/21/16 1831 11/21/16 1844 11/21/16 2000 11/21/16 2045  BP: 120/72  115/65 110/70  Pulse: 74 77 68 70  Resp: (!) 8  Temp: 97.8 F (36.6 C)     TempSrc: Oral     SpO2: 100% 100% 100% 100%     Final Clinical Impressions(s) / ED Diagnoses   Final diagnoses:  Dizziness  Hyponatremia    New Prescriptions New Prescriptions   No medications on file     Jaynie Crumble, Cordelia Poche 11/21/16 2251    Shaune Pollack, MD 11/23/16 1012

## 2016-11-21 NOTE — ED Triage Notes (Signed)
Per ems pt was at home and called for assistance due to dizziness, SOB and lightheaded. Pt was anxious apon there arrival and was placed on a non rebreather. EMS STATED HE HOOKED HER TO A RAINBOW CABLE AND co LEVELS WERE 19. Ems Stated she had red marking around her mouth and stated she felt she had been poisoned by gas in her car. 140/60, 84 HR, 112 CBG

## 2016-11-21 NOTE — Discharge Instructions (Addendum)
Your sodium and chloride were low today. You should have this re-checked in the next week. Avoid excessive water intake. Please check your supplements to see if any of them list low sodium (hyponatremia) as a side effect. It is okay to eat salted foods, and encouraged for now.  Buy a carbon monoxide monitor for your house.

## 2016-11-21 NOTE — ED Notes (Signed)
Called [facility] and asked if the

## 2017-04-19 ENCOUNTER — Encounter (HOSPITAL_COMMUNITY): Payer: Self-pay | Admitting: Emergency Medicine

## 2017-04-19 ENCOUNTER — Ambulatory Visit (HOSPITAL_COMMUNITY)
Admission: EM | Admit: 2017-04-19 | Discharge: 2017-04-19 | Disposition: A | Discharge status: Home / Self Care | Payer: BLUE CROSS/BLUE SHIELD | Attending: Emergency Medicine | Admitting: Emergency Medicine

## 2017-04-19 DIAGNOSIS — Z8249 Family history of ischemic heart disease and other diseases of the circulatory system: Secondary | ICD-10-CM | POA: Diagnosis not present

## 2017-04-19 DIAGNOSIS — N3000 Acute cystitis without hematuria: Secondary | ICD-10-CM | POA: Diagnosis not present

## 2017-04-19 DIAGNOSIS — R350 Frequency of micturition: Secondary | ICD-10-CM

## 2017-04-19 DIAGNOSIS — F419 Anxiety disorder, unspecified: Secondary | ICD-10-CM | POA: Diagnosis not present

## 2017-04-19 DIAGNOSIS — Z818 Family history of other mental and behavioral disorders: Secondary | ICD-10-CM | POA: Insufficient documentation

## 2017-04-19 DIAGNOSIS — Z7952 Long term (current) use of systemic steroids: Secondary | ICD-10-CM | POA: Insufficient documentation

## 2017-04-19 DIAGNOSIS — R1030 Lower abdominal pain, unspecified: Secondary | ICD-10-CM | POA: Diagnosis not present

## 2017-04-19 DIAGNOSIS — Z803 Family history of malignant neoplasm of breast: Secondary | ICD-10-CM | POA: Insufficient documentation

## 2017-04-19 DIAGNOSIS — Z9889 Other specified postprocedural states: Secondary | ICD-10-CM | POA: Diagnosis not present

## 2017-04-19 DIAGNOSIS — Z79899 Other long term (current) drug therapy: Secondary | ICD-10-CM | POA: Diagnosis not present

## 2017-04-19 DIAGNOSIS — N870 Mild cervical dysplasia: Secondary | ICD-10-CM | POA: Diagnosis not present

## 2017-04-19 DIAGNOSIS — Z885 Allergy status to narcotic agent status: Secondary | ICD-10-CM | POA: Diagnosis not present

## 2017-04-19 LAB — POCT URINALYSIS DIP (DEVICE)
Bilirubin Urine: NEGATIVE
GLUCOSE, UA: NEGATIVE mg/dL
Hgb urine dipstick: NEGATIVE
Ketones, ur: NEGATIVE mg/dL
LEUKOCYTES UA: NEGATIVE
NITRITE: NEGATIVE
PROTEIN: NEGATIVE mg/dL
SPECIFIC GRAVITY, URINE: 1.015 (ref 1.005–1.030)
UROBILINOGEN UA: 0.2 mg/dL (ref 0.0–1.0)
pH: 8.5 — ABNORMAL HIGH (ref 5.0–8.0)

## 2017-04-19 MED ORDER — CEPHALEXIN 500 MG PO CAPS: 500.0000 mg | ORAL_CAPSULE | Freq: Four times a day (QID) | ORAL | 0 refills | Status: DC

## 2017-04-19 MED FILL — CEPHALEXIN 500 MG CAPSULE: 500 | 5 days supply | Qty: 20 | Fill #0

## 2017-04-19 NOTE — Discharge Instructions (Signed)
If the medication does not help with symptoms please follow up with your doctor to have urine rechecked  We will send your urine off for a culture and if ti returns positive we will call you Stay hydrated

## 2017-04-19 NOTE — ED Provider Notes (Signed)
[facility]    CSN: [id number] Arrival date & time: 04/19/17  1438     History   Chief Complaint Chief Complaint  Patient presents with  . Urinary Tract Infection    HPI Ashley [name] is a 47 y.o. female.   Pt states that she has had urinary frequency and lower abd pain for a few days states that the sx have been going on for 3 days now. Took a natural herb to help with the sx which have not gone away. Denies any fevers.       Past Medical History:  Diagnosis Date  . Anxiety   . ASCUS favor benign 04/2013   Negative high risk HPV screen. Recommend repeat Pap smear/HPV one year  . 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 Endometrial polyp 04/2013   Removed in the office extruding from the cervical os  . Migraines     Patient Active Problem List   Diagnosis Date Noted  . Dysplasia of cervix, low grade (CIN 1) 09/29/2015    Past Surgical History:  Procedure Laterality Date  . DILATATION & CURETTAGE/HYSTEROSCOPY WITH TRUECLEAR N/A 09/18/2013   Procedure: DILATATION & CURETTAGE/HYSTEROSCOPY WITH TRUCLEAR;  Surgeon: Dara Lords, MD;  Location: [facility];  Service: Gynecology;  Laterality: N/A;  . WISDOM TOOTH EXTRACTION Bilateral     OB History    Gravida Para Term Preterm AB Living   0             SAB TAB Ectopic Multiple Live Births                   Home Medications    Prior to Admission medications   Medication Sig Start Date End Date Taking? Authorizing Provider  GARLIC PO Take 1 capsule by mouth daily.   Yes [provider]  MAGNESIUM PO Take 1 tablet by mouth daily.   Yes [provider]  Multiple Vitamins-Minerals (MULTIVITAMIN WITH MINERALS) tablet Take 1 tablet by mouth daily.   Yes [provider]  TURMERIC PO Take 1 capsule by mouth daily.   Yes [provider]  amoxicillin (AMOXIL) 500 MG capsule Take 1 capsule (500 mg total) by mouth 2 (two) times daily. Patient not taking:  Reported on 10/09/2014 08/08/14   Ozella Rocks, MD  cephALEXin (KEFLEX) 500 MG capsule Take 1 capsule (500 mg total) by mouth 4 (four) times daily. 04/19/17   Coralyn Mark, NP  predniSONE (DELTASONE) 10 MG tablet Take 2 tablets twice a day for 6 days. Patient not taking: Reported on 11/20/2016 03/31/16   Dorena Bodo, NP    Family History Family History  Problem Relation Age of Onset  . Bipolar disorder Mother   . Mental illness Mother   . Parkinsonism Father   . Hypertension Father   . Cancer Paternal Grandmother        Breast    Social History Social History   Tobacco Use  . Smoking status: Never Smoker  . Smokeless tobacco: Never Used  Substance Use Topics  . Alcohol use: Yes    Comment: occ  . Drug use: No     Allergies   Codeine   Review of Systems Review of Systems  Constitutional: Negative.   Respiratory: Negative.   Cardiovascular: Negative.   Gastrointestinal: Positive for abdominal pain.  Genitourinary: Positive for frequency and urgency.     Physical Exam Triage Vital Signs ED Triage Vitals  Enc Vitals Group  BP 04/19/17 1551 119/60     Pulse Rate 04/19/17 1551 80     Resp 04/19/17 1551 16     Temp 04/19/17 1551 98.4 F (36.9 C)     Temp Source 04/19/17 1551 Oral     SpO2 04/19/17 1551 100 %     Weight 04/19/17 1552 125 lb (56.7 kg)     Height 04/19/17 1552 5\' 6"  (1.676 m)     Head Circumference --      Peak Flow --      Pain Score 04/19/17 1551 2     Pain Loc --      Pain Edu? --      Excl. in GC? --    No data found.  Updated Vital Signs BP 119/60   Pulse 80   Temp 98.4 F (36.9 C) (Oral)   Resp 16   Ht 5\' 6"  (1.676 m)   Wt 125 lb (56.7 kg)   LMP 08/01/2014 (Exact Date)   SpO2 100%   BMI 20.18 kg/m        Physical Exam  Constitutional: She appears well-developed.  Cardiovascular: Normal rate and regular rhythm.  Pulmonary/Chest: Effort normal and breath sounds normal.  Abdominal: There is tenderness.    Lower abd tenderness , internmit cv tenderness, no fevers,   Neurological: She is alert.     UC Treatments / Results  Labs (all labs ordered are listed, but only abnormal results are displayed) Labs Reviewed  POCT URINALYSIS DIP (DEVICE) - Abnormal; Notable for the following components:      Result Value   pH 8.5 (*)    All other components within normal limits  URINE CULTURE    EKG  EKG Interpretation None       Radiology No results found.  Procedures Procedures (including critical care time)  Medications Ordered in UC Medications - No data to display   Initial Impression / Assessment and Plan / UC Course  I have reviewed the triage vital signs and the nursing notes.  Pertinent labs & imaging results that were available during my care of the patient were reviewed by me and considered in my medical decision making (see chart for details).     If the medication does not help with symptoms please follow up with your doctor to have urine rechecked  We will send your urine off for a culture and if ti returns positive we will call you Stay hydrated   Final Clinical Impressions(s) / UC Diagnoses   Final diagnoses:  Acute cystitis without hematuria    ED Discharge Orders        Ordered    cephALEXin (KEFLEX) 500 MG capsule  4 times daily     04/19/17 1632       Controlled Substance Prescriptions [place] Controlled Substance Registry consulted? Not Applicable   Coralyn MarkMitchell, Mikias Lanz L, NP 04/19/17 [phone]

## 2017-04-19 NOTE — ED Triage Notes (Signed)
PT reports dysuria, frequency, and abdominal discomfort for 2 days

## 2017-04-21 LAB — URINE CULTURE: Culture: NO GROWTH

## 2017-04-24 ENCOUNTER — Telehealth (HOSPITAL_COMMUNITY): Payer: Self-pay | Admitting: Emergency Medicine

## 2017-04-24 MED ORDER — FLUCONAZOLE 150 MG PO TABS: 150.0000 mg | ORAL_TABLET | Freq: Every day | ORAL | 0 refills | Status: DC

## 2017-04-24 NOTE — Telephone Encounter (Signed)
Pt asking for diflucan for yeast infection she developed from taking antibiotics.  Linus MakoNatalie Burky, NP stated it was ok to send a prescription [facility] 1 [facility].

## 2017-04-25 MED FILL — FLUCONAZOLE 150 MG TABLET: 150 | 1 days supply | Qty: 1 | Fill #0

## 2017-06-08 ENCOUNTER — Encounter: Payer: Self-pay | Admitting: Family Medicine

## 2017-06-08 ENCOUNTER — Ambulatory Visit: Payer: BLUE CROSS/BLUE SHIELD | Admitting: Family Medicine

## 2017-06-08 ENCOUNTER — Telehealth: Payer: Self-pay | Admitting: Family Medicine

## 2017-06-08 VITALS — BP 110/72 | HR 78 | Ht 66.0 in | Wt 126.0 lb

## 2017-06-08 DIAGNOSIS — G44039 Episodic paroxysmal hemicrania, not intractable: Secondary | ICD-10-CM

## 2017-06-08 DIAGNOSIS — R112 Nausea with vomiting, unspecified: Secondary | ICD-10-CM | POA: Diagnosis not present

## 2017-06-08 DIAGNOSIS — Z0000 Encounter for general adult medical examination without abnormal findings: Secondary | ICD-10-CM

## 2017-06-08 LAB — COMPREHENSIVE METABOLIC PANEL WITH GFR
ALT: 24 U/L (ref 0–35)
AST: 20 U/L (ref 0–37)
Albumin: 4.5 g/dL (ref 3.5–5.2)
Alkaline Phosphatase: 81 U/L (ref 39–117)
BUN: 8 mg/dL (ref 6–23)
CO2: 29 meq/L (ref 19–32)
Calcium: 9.3 mg/dL (ref 8.4–10.5)
Chloride: 98 meq/L (ref 96–112)
Creatinine, Ser: 0.69 mg/dL (ref 0.40–1.20)
GFR: 96.92 mL/min
Glucose, Bld: 105 mg/dL — ABNORMAL HIGH (ref 70–99)
Potassium: 4.2 meq/L (ref 3.5–5.1)
Sodium: 133 meq/L — ABNORMAL LOW (ref 135–145)
Total Bilirubin: 0.7 mg/dL (ref 0.2–1.2)
Total Protein: 7 g/dL (ref 6.0–8.3)

## 2017-06-08 LAB — CBC
HCT: 38.9 % (ref 36.0–46.0)
Hemoglobin: 13.1 g/dL (ref 12.0–15.0)
MCHC: 33.7 g/dL (ref 30.0–36.0)
MCV: 95.6 fl (ref 78.0–100.0)
Platelets: 192 10*3/uL (ref 150.0–400.0)
RBC: 4.07 Mil/uL (ref 3.87–5.11)
RDW: 12.9 % (ref 11.5–15.5)
WBC: 9.1 10*3/uL (ref 4.0–10.5)

## 2017-06-08 LAB — URINALYSIS, ROUTINE W REFLEX MICROSCOPIC
BILIRUBIN URINE: NEGATIVE
Hgb urine dipstick: NEGATIVE
KETONES UR: NEGATIVE
Leukocytes, UA: NEGATIVE
NITRITE: NEGATIVE
RBC / HPF: NONE SEEN (ref 0–?)
Specific Gravity, Urine: <=1.005 — AB (ref 1.000–1.030)
Total Protein, Urine: NEGATIVE
UROBILINOGEN UA: 0.2 (ref 0.0–1.0)
Urine Glucose: NEGATIVE
WBC UA: NONE SEEN (ref 0–?)
pH: 7.5 (ref 5.0–8.0)

## 2017-06-08 LAB — LIPID PANEL
CHOLESTEROL: 137 mg/dL (ref 0–200)
HDL: 52.1 mg/dL (ref >39.00)
LDL Cholesterol: 70 mg/dL (ref 0–99)
NONHDL: 85.05
Total CHOL/HDL Ratio: 3
Triglycerides: 73 mg/dL (ref 0.0–149.0)
VLDL: 14.6 mg/dL (ref 0.0–40.0)

## 2017-06-08 MED ORDER — ZOLMITRIPTAN 5 MG PO TBDP: 5.0000 mg | ORAL_TABLET | ORAL | 0 refills | Status: DC | PRN

## 2017-06-08 MED ORDER — ONDANSETRON HCL 8 MG PO TABS: 8.0000 mg | ORAL_TABLET | Freq: Three times a day (TID) | ORAL | 0 refills | Status: DC | PRN
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
## 2017-06-08 NOTE — Telephone Encounter (Signed)
Copied from CRM [id number]. Topic: General - Other >> Jun 08, 2017  3:25 PM Lorrine KinMcGee, Tali Cleaves B, [place] wrote: Reason for CRM: Patient calling requesting a doctor's note for her visit today 06/08/17. She would like to receive this via MyChart if possible.

## 2017-06-08 NOTE — Patient Instructions (Signed)
Preventive Care 40-64 Years, Female Preventive care refers to lifestyle choices and visits with your health care provider that can promote health and wellness. What does preventive care include?  A yearly physical exam. This is also called an annual well check.  Dental exams once or twice a year.  Routine eye exams. Ask your health care provider how often you should have your eyes checked.  Personal lifestyle choices, including: ? Daily care of your teeth and gums. ? Regular physical activity. ? Eating a healthy diet. ? Avoiding tobacco and drug use. ? Limiting alcohol use. ? Practicing safe sex. ? Taking low-dose aspirin daily starting at age 47. ? Taking vitamin and mineral supplements as recommended by your health care provider. What happens during an annual well check? The services and screenings done by your health care provider during your annual well check will depend on your age, overall health, lifestyle risk factors, and family history of disease. Counseling Your health care provider may ask you questions about your:  Alcohol use.  Tobacco use.  Drug use.  Emotional well-being.  Home and relationship well-being.  Sexual activity.  Eating habits.  Work and work Statistician.  Method of birth control.  Menstrual cycle.  Pregnancy history.  Screening You may have the following tests or measurements:  Height, weight, and BMI.  Blood pressure.  Lipid and cholesterol levels. These may be checked every 5 years, or more frequently if you are over 47 years old.  Skin check.  Lung cancer screening. You may have this screening every year starting at age 47 if you have a 30-pack-year history of smoking and currently smoke or have quit within the past 15 years.  Fecal occult blood test (FOBT) of the stool. You may have this test every year starting at age 47.  Flexible sigmoidoscopy or colonoscopy. You may have a sigmoidoscopy every 5 years or a colonoscopy  every 10 years starting at age 47.  Hepatitis C blood test.  Hepatitis B blood test.  Sexually transmitted disease (STD) testing.  Diabetes screening. This is done by checking your blood sugar (glucose) after you have not eaten for a while (fasting). You may have this done every 1-3 years.  Mammogram. This may be done every 1-2 years. Talk to your health care provider about when you should start having regular mammograms. This may depend on whether you have a family history of breast cancer.  BRCA-related cancer screening. This may be done if you have a family history of breast, ovarian, tubal, or peritoneal cancers.  Pelvic exam and Pap test. This may be done every 3 years starting at age 80. Starting at age 36, this may be done every 5 years if you have a Pap test in combination with an HPV test.  Bone density scan. This is done to screen for osteoporosis. You may have this scan if you are at high risk for osteoporosis.  Discuss your test results, treatment options, and if necessary, the need for more tests with your health care provider. Vaccines Your health care provider may recommend certain vaccines, such as:  Influenza vaccine. This is recommended every year.  Tetanus, diphtheria, and acellular pertussis (Tdap, Td) vaccine. You may need a Td booster every 10 years.  Varicella vaccine. You may need this if you have not been vaccinated.  Zoster vaccine. You may need this after age 5.  Measles, mumps, and rubella (MMR) vaccine. You may need at least one dose of MMR if you were born in  1957 or later. You may also need a second dose.  Pneumococcal 13-valent conjugate (PCV13) vaccine. You may need this if you have certain conditions and were not previously vaccinated.  Pneumococcal polysaccharide (PPSV23) vaccine. You may need one or two doses if you smoke cigarettes or if you have certain conditions.  Meningococcal vaccine. You may need this if you have certain  conditions.  Hepatitis A vaccine. You may need this if you have certain conditions or if you travel or work in places where you may be exposed to hepatitis A.  Hepatitis B vaccine. You may need this if you have certain conditions or if you travel or work in places where you may be exposed to hepatitis B.  Haemophilus influenzae type b (Hib) vaccine. You may need this if you have certain conditions.  Talk to your health care provider about which screenings and vaccines you need and how often you need them. This information is not intended to replace advice given to you by your health care provider. Make sure you discuss any questions you have with your health care provider. Document Released: 03/14/2015 Document Revised: 11/05/2015 Document Reviewed: 12/17/2014 Elsevier Interactive Patient Education  2018 [employer]. Zolmitriptan disintegrating tablets What is this medicine? ZOLMITRIPTAN (zohl mi TRIP tan) is used to treat migraines with or without aura. An aura is a strange feeling or visual disturbance that warns you of an attack. It is not used to prevent migraines. This medicine may be used for other purposes; ask your health care provider or pharmacist if you have questions. COMMON BRAND NAME(S): Zomig -ZMT What should I tell my health care provider before I take this medicine? They need to know if you have any of these conditions: -bowel disease, colitis or bloody diarrhea -diabetes -family history of heart disease -fast or irregular heartbeat -headaches that are different from your usual migraine -heart or blood vessel disease, angina (chest pain), or previous heart attack -high blood pressure -high cholesterol -history of stroke, transient ischemic attacks (TIAs or mini-strokes), or intracranial bleeding -kidney or liver disease -overweight -postmenopausal or surgical removal of uterus and ovaries -seizure disorder -tobacco smoker -an unusual or allergic reaction to  zolmitriptan, other medicines, foods, dyes, or preservatives -pregnant or trying to get pregnant -breast-feeding How should I use this medicine? Take this medicine by mouth. Follow the directions on the prescription label. This medicine is taken at the first symptoms of a migraine. It is not for everyday use. Leave the tablet in the foil package until you are ready to take it. Do not push the tablet through the blister pack. Peel open the blister pack with dry hands and place the tablet on your tongue. The tablet will dissolve rapidly and be swallowed in your saliva. It is not necessary to drink any water to take this medicine. If your migraine headache returns after one dose, you can take another dose as directed. You must allow at least 2 hours between doses, and do not take more than 10 mg total in any 24 hour period. If there is no improvement at all after the first dose, do not take a second dose without talking to your doctor or health care professional. Do not take your medicine more often than directed. Talk to your pediatrician regarding the use of this medicine in children. Special care may be needed. Overdosage: If you think you have taken too much of this medicine contact a poison control center or emergency room at once. NOTE: This medicine is only  for you. Do not share this medicine with others. What if I miss a dose? This does not apply; this medicine is not for regular use. What may interact with this medicine? Do not take this medicine with any of the following medicines: -amphetamine or cocaine -dihydroergotamine, ergotamine, ergoloid mesylates, methysergide, or ergot-type medication - do not take within 24 hours of taking zolmitriptan -feverfew -MAOIs like Carbex, Eldepryl, Marplan, Nardil, and Parnate - do not take zolmitriptan within 2 weeks of stopping MAOI therapy -other migraine medicines like almotriptan, eletriptan, naratriptan, rizatriptan, sumatriptan - do not take within  24 hours of taking zolmitriptan -tryptophan This medicine may also interact with the following medications: -cimetidine -birth control pills -medicines for mental depression, anxiety or mood problems -propranolol This list may not describe all possible interactions. Give your health care provider a list of all the medicines, herbs, non-prescription drugs, or dietary supplements you use. Also tell them if you smoke, drink alcohol, or use illegal drugs. Some items may interact with your medicine. What should I watch for while using this medicine? Only take this medicine for a migraine headache. Take it if you get warning symptoms or at the start of a migraine attack. It is not for regular use to prevent migraine attacks. You may get drowsy or dizzy. Do not drive, use machinery, or do anything that needs mental alertness until you know how this medicine affects you. To reduce dizzy or fainting spells, do not sit or stand up quickly, especially if you are an older patient. Alcohol can increase drowsiness, dizziness and flushing. Avoid alcoholic drinks. Smoking cigarettes may increase the risk of heart-related side effects from using this medicine. If you take migraine medicines for 10 or more days a month, your migraines may get worse. Keep a diary of headache days and medicine use. Contact your healthcare professional if your migraine attacks occur more frequently. What side effects may I notice from receiving this medicine? Side effects that you should report to your doctor or health care professional as soon as possible: -allergic reactions like skin rash, itching or hives, swelling of the face, lips, or tongue -fast, slow, or irregular heart beat -increased blood pressure -palpitations -severe stomach pain and cramping, bloody diarrhea -signs and symptoms of a blood clot such as breathing problems; changes in vision; chest pain; severe, sudden headache; pain, swelling, warmth in the leg; trouble  speaking; sudden numbness or weakness of the face, arm or leg -tingling, pain, or numbness in the face, hands, or feet Side effects that usually do not require medical attention (report to your doctor or health care professional if they continue or are bothersome): -dry mouth -feeling warm, flushing,or redness of the face -headache -muscle cramps, pain -nausea, vomiting -unusually tired or weak This list may not describe all possible side effects. Call your doctor for medical advice about side effects. You may report side effects to FDA at 1-800-FDA-1088. Where should I keep my medicine? Keep out of the reach of children. Store at room temperature between 20 and 25 degrees C (68 and 77 degrees F). Protect from light and moisture. Throw away any unused medicine after the expiration date. NOTE: This sheet is a summary. It may not cover all possible information. If you have questions about this medicine, talk to your doctor, pharmacist, or health care provider.  2018 Elsevier/Gold Standard (2012-10-17 10:21:19)

## 2017-06-08 NOTE — Telephone Encounter (Signed)
Letter completed & message sent to patient.

## 2017-06-09 ENCOUNTER — Encounter: Payer: Self-pay | Admitting: Family Medicine

## 2017-06-09 LAB — HIV ANTIBODY (ROUTINE TESTING W REFLEX): HIV: NONREACTIVE

## 2017-09-21 ENCOUNTER — Ambulatory Visit (INDEPENDENT_AMBULATORY_CARE_PROVIDER_SITE_OTHER): Payer: BLUE CROSS/BLUE SHIELD | Admitting: Internal Medicine

## 2017-09-21 ENCOUNTER — Encounter: Payer: Self-pay | Admitting: Internal Medicine

## 2017-09-21 ENCOUNTER — Other Ambulatory Visit (INDEPENDENT_AMBULATORY_CARE_PROVIDER_SITE_OTHER): Payer: BLUE CROSS/BLUE SHIELD

## 2017-09-21 VITALS — BP 116/70 | HR 70 | Temp 97.7°F | Ht 66.0 in | Wt 129.0 lb

## 2017-09-21 DIAGNOSIS — E871 Hypo-osmolality and hyponatremia: Secondary | ICD-10-CM | POA: Diagnosis not present

## 2017-09-21 DIAGNOSIS — R1030 Lower abdominal pain, unspecified: Secondary | ICD-10-CM | POA: Diagnosis not present

## 2017-09-21 DIAGNOSIS — N912 Amenorrhea, unspecified: Secondary | ICD-10-CM | POA: Diagnosis not present

## 2017-09-21 DIAGNOSIS — F419 Anxiety disorder, unspecified: Secondary | ICD-10-CM | POA: Diagnosis not present

## 2017-09-21 DIAGNOSIS — R109 Unspecified abdominal pain: Secondary | ICD-10-CM | POA: Insufficient documentation

## 2017-09-21 DIAGNOSIS — G43909 Migraine, unspecified, not intractable, without status migrainosus: Secondary | ICD-10-CM | POA: Insufficient documentation

## 2017-09-21 LAB — URINALYSIS, ROUTINE W REFLEX MICROSCOPIC
Bilirubin Urine: NEGATIVE
HGB URINE DIPSTICK: NEGATIVE
KETONES UR: NEGATIVE
LEUKOCYTES UA: NEGATIVE
NITRITE: NEGATIVE
RBC / HPF: NONE SEEN (ref 0–?)
Specific Gravity, Urine: <=1.005 — AB (ref 1.000–1.030)
Total Protein, Urine: NEGATIVE
Urine Glucose: NEGATIVE
Urobilinogen, UA: 0.2 (ref 0.0–1.0)
WBC, UA: NONE SEEN (ref 0–?)
pH: 6.5 (ref 5.0–8.0)

## 2017-09-21 NOTE — Assessment & Plan Note (Signed)
D/w pt, she states today is more situational only and declines further tx such as ssri

## 2017-09-21 NOTE — Patient Instructions (Signed)
Please continue all other medications as before, and refills have been done if requested.  Please have the pharmacy call with any other refills you may need.  Please keep your appointments with your specialists as you may have planned  Your specimen will be sent to the lab with results later today  Please go to the LAB in the Basement (turn left off the elevator) for the tests to be done today  You will be contacted by phone if any changes need to be made immediately.  Otherwise, you will receive a letter about your results with an explanation, but please check with MyChart first.  Please remember to sign up for MyChart if you have not done so, as this will be important to you in the future with finding out test results, communicating by private email, and scheduling acute appointments online when needed.

## 2017-09-21 NOTE — Assessment & Plan Note (Signed)
Chronic mild persistent, suspect due to hyper fluid intake,  to f/u any worsening symptoms or concerns

## 2017-09-21 NOTE — Progress Notes (Signed)
Subjective:    Patient ID: Ashley [name], female    DOB: 03-09-1970, 47 y.o.   MRN: [id number]  HPI  Here to f/u with c/o pain low abd x 1 wk, for some reason better today, has been taking cranberry pill,  has been maybe more right sided without radiation, no n/v, no fever, no pain on urination, maybe some frequency, Denies urinary symptoms suchas urgency or  flank pain, hematuria or n/v, fever, chills.   Last episode uti feb 2019 per pt though cx neg, better with cephalexin. Not sexaully active for several months, LMP x 2 yrs pt states due to early menopause in her family;  [facility].  [facility], has hx of cervical dysplasia CIN 1 with f/u pap negative last yr.  Denies worsening reflux, other abd pain, dysphagia, n/v, bowel change or blood, BM's seem usual to her.  Asking to check for ear wax impactions. Drinks plenty of fluids and has chronic mild low sodium. Past Medical History:  Diagnosis Date  . Anxiety   . ASCUS favor benign 04/2013   Negative high risk HPV screen. Recommend repeat Pap smear/HPV one year  . 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 Endometrial polyp 04/2013   Removed in the office extruding from the cervical os  . Migraines    Past Surgical History:  Procedure Laterality Date  . DILATATION & CURETTAGE/HYSTEROSCOPY WITH TRUECLEAR N/A 09/18/2013   Procedure: DILATATION & CURETTAGE/HYSTEROSCOPY WITH TRUCLEAR;  Surgeon: Dara Lordsimothy P Fontaine, MD;  Location: [facility];  Service: Gynecology;  Laterality: N/A;  . WISDOM TOOTH EXTRACTION Bilateral     reports that she has never smoked. She has never used smokeless tobacco. She reports that she drinks alcohol. She reports that she does not use drugs. family history includes Bipolar disorder in her mother; Cancer in her paternal grandmother; Hypertension in her father; Mental illness in her mother; Parkinsonism in her father. Allergies  Allergen Reactions  . Codeine  Nausea Only   Current Outpatient Medications on File Prior to Visit  Medication Sig Dispense Refill  . GARLIC PO Take 1 capsule by mouth daily.    Marland Kitchen. MAGNESIUM PO Take 1 tablet by mouth daily.    . Multiple Vitamins-Minerals (MULTIVITAMIN WITH MINERALS) tablet Take 1 tablet by mouth daily.    . ondansetron (ZOFRAN) 8 MG tablet Take 1 tablet (8 mg total) by mouth every 8 (eight) hours as needed for nausea or vomiting. 20 tablet 0  . TURMERIC PO Take 1 capsule by mouth daily.    Marland Kitchen. zolmitriptan (ZOMIG-ZMT) 5 MG disintegrating tablet Take 1 tablet (5 mg total) by mouth as needed for migraine. 10 tablet 0   No current facility-administered medications on file prior to visit.    Review of Systems  Constitutional: Negative for other unusual diaphoresis or sweats HENT: Negative for ear discharge or swelling Eyes: Negative for other worsening visual disturbances Respiratory: Negative for stridor or other swelling  Gastrointestinal: Negative for worsening distension or other blood Genitourinary: Negative for retention or other urinary change Musculoskeletal: Negative for other MSK pain or swelling Skin: Negative for color change or other new lesions Neurological: Negative for worsening tremors and other numbness  Psychiatric/Behavioral: Negative for worsening agitation or other fatigue All other system neg per pt    Objective:   Physical Exam BP 116/70   Pulse 70   Temp 97.7 F (36.5 C) (Oral)   Ht  5\' 6"  (1.676 m)   Wt 129 lb (58.5 kg)   LMP 08/01/2014 (Exact Date)   SpO2 99%   BMI 20.82 kg/m  VS noted, not ill appearing but hesitant and nervous Constitutional: Pt appears in NAD HENT: Head: NCAT.  Right Ear: External ear normal.  Left Ear: External ear normal.  Eyes: . Pupils are equal, round, and reactive to light. Conjunctivae and EOM are normal Nose: without d/c or deformity Neck: Neck supple. Gross normal ROM Cardiovascular: Normal rate and regular rhythm.   Pulmonary/Chest:  Effort normal and breath sounds without rales or wheezing.  Abd:  Soft, NT, ND, + BS, no organomegaly - benign Neurological: Pt is alert. At baseline orientation, motor grossly intact Skin: Skin is warm. No rashes, other new lesions, no LE edema Psychiatric: Pt behavior is normal without agitation , 1-2+ nervous No other exam findings Lab Results  Component Value Date   WBC 9.1 06/08/2017   HGB 13.1 06/08/2017   HCT 38.9 06/08/2017   PLT 192.0 06/08/2017   GLUCOSE 105 (H) 06/08/2017   CHOL 137 06/08/2017   TRIG 73.0 06/08/2017   HDL 52.10 06/08/2017   LDLCALC 70 06/08/2017   ALT 24 06/08/2017   AST 20 06/08/2017   NA 133 (L) 06/08/2017   K 4.2 06/08/2017   CL 98 06/08/2017   CREATININE 0.69 06/08/2017   BUN 8 06/08/2017   CO2 29 06/08/2017   TSH 1.687 11/21/2016       Assessment & Plan:

## 2017-09-21 NOTE — Assessment & Plan Note (Signed)
[facility], pt states will f/u with GYN

## 2017-09-21 NOTE — Assessment & Plan Note (Signed)
Low mid abd to right sided, no pain today, exam benign, will check UA and cx as this is her most concern, also other labs as ordered, may need antibx pending results

## 2017-09-22 LAB — URINE CULTURE
MICRO NUMBER: 90876001
RESULT: NO GROWTH
SPECIMEN QUALITY:: ADEQUATE

## 2017-12-15 ENCOUNTER — Other Ambulatory Visit (HOSPITAL_COMMUNITY)
Admission: RE | Admit: 2017-12-15 | Discharge: 2017-12-15 | Disposition: A | Discharge status: Home / Self Care | Payer: BLUE CROSS/BLUE SHIELD | Source: Ambulatory Visit | Attending: Obstetrics and Gynecology | Admitting: Obstetrics and Gynecology

## 2017-12-15 ENCOUNTER — Other Ambulatory Visit: Payer: Self-pay

## 2017-12-15 ENCOUNTER — Encounter: Payer: Self-pay | Admitting: Obstetrics and Gynecology

## 2017-12-15 ENCOUNTER — Ambulatory Visit: Payer: BLUE CROSS/BLUE SHIELD | Admitting: Obstetrics and Gynecology

## 2017-12-15 VITALS — BP 108/70 | HR 68 | Ht 67.0 in | Wt 125.2 lb

## 2017-12-15 DIAGNOSIS — Z23 Encounter for immunization: Secondary | ICD-10-CM | POA: Diagnosis not present

## 2017-12-15 DIAGNOSIS — Z01419 Encounter for gynecological examination (general) (routine) without abnormal findings: Secondary | ICD-10-CM

## 2017-12-15 DIAGNOSIS — Z124 Encounter for screening for malignant neoplasm of cervix: Secondary | ICD-10-CM

## 2017-12-15 DIAGNOSIS — G43909 Migraine, unspecified, not intractable, without status migrainosus: Secondary | ICD-10-CM | POA: Insufficient documentation

## 2017-12-15 DIAGNOSIS — R5383 Other fatigue: Secondary | ICD-10-CM

## 2017-12-15 DIAGNOSIS — F419 Anxiety disorder, unspecified: Secondary | ICD-10-CM | POA: Diagnosis not present

## 2017-12-15 DIAGNOSIS — N912 Amenorrhea, unspecified: Secondary | ICD-10-CM

## 2017-12-15 DIAGNOSIS — Z1211 Encounter for screening for malignant neoplasm of colon: Secondary | ICD-10-CM

## 2017-12-15 DIAGNOSIS — Z780 Asymptomatic menopausal state: Secondary | ICD-10-CM | POA: Diagnosis not present

## 2017-12-15 DIAGNOSIS — Z79899 Other long term (current) drug therapy: Secondary | ICD-10-CM | POA: Insufficient documentation

## 2017-12-15 NOTE — Addendum Note (Signed)
Addended by: Tobi Bastos on: 12/15/2017 04:46 PM   Modules accepted: Orders

## 2017-12-15 NOTE — Progress Notes (Signed)
47 y.o. G0P0000 Single White or Caucasian Not Hispanic or Latino female here for annual exam. Would like labs done today to check for hormones. No cycle 2 years.  Previously had vasomotor symptoms. Some vaginal dryness. Not sleeping as well. She does c/o fatigue.  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 Gets headaches with valtrex, does okay with supplements.     Patient's last menstrual period was 08/01/2014 (exact date).          Sexually active: No.  The current method of family planning is none.    Exercising: Yes.    walking Smoker:  no  Health Maintenance: [facility]  [facility] History of abnormal Pap:  Yes in 2017 LGSIL, [facility] for a colposcopy, reports normal results MMG:  08/29/2015 Birads 1 negative TDaP:  2010 Gardasil:Never    reports that she has never smoked. She has never used smokeless tobacco. She reports that she drinks alcohol. She reports that she does not use drugs. She works at [employer].   Past Medical History:  Diagnosis Date  . Anxiety   . ASCUS favor benign 04/2013   Negative high risk HPV screen. Recommend repeat Pap smear/HPV one year  . 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 Endometrial polyp 04/2013   Removed in the office extruding from the cervical os  . Hormone disorder   . Migraines   . Urinary incontinence   Rare GSI, tolerable  Past Surgical History:  Procedure Laterality Date  . DILATATION & CURETTAGE/HYSTEROSCOPY WITH TRUECLEAR N/A 09/18/2013   Procedure: DILATATION & CURETTAGE/HYSTEROSCOPY WITH TRUCLEAR;  Surgeon: Dara Lords, MD;  Location: [facility];  Service: Gynecology;  Laterality: N/A;  . WISDOM TOOTH EXTRACTION Bilateral     Current Outpatient Medications  Medication Sig Dispense Refill  . GARLIC PO Take 1 capsule by mouth daily.    Marland Kitchen MAGNESIUM PO Take 1 tablet by mouth daily.    . Multiple Vitamins-Minerals (MULTIVITAMIN WITH MINERALS) tablet  Take 1 tablet by mouth daily.    . ondansetron (ZOFRAN) 8 MG tablet Take 1 tablet (8 mg total) by mouth every 8 (eight) hours as needed for nausea or vomiting. 20 tablet 0  . TURMERIC PO Take 1 capsule by mouth daily.    Marland Kitchen zolmitriptan (ZOMIG-ZMT) 5 MG disintegrating tablet Take 1 tablet (5 mg total) by mouth as needed for migraine. 10 tablet 0   No current facility-administered medications for this visit.     Family History  Problem Relation Age of Onset  . Bipolar disorder Mother   . Mental illness Mother   . Parkinsonism Father   . Hypertension Father   . Cancer Paternal Grandmother        Breast    Review of Systems  Constitutional: Negative.   HENT: Negative.   Eyes: Negative.   Respiratory: Negative.   Cardiovascular: Negative.   Gastrointestinal: Negative.   Endocrine: Negative.   Genitourinary: Negative.        Vaginal dryness Low libido  Musculoskeletal: Negative.   Skin: Negative.   Allergic/Immunologic: Negative.   Neurological: Negative.   Hematological: Negative.   Psychiatric/Behavioral: Negative.     Exam:   BP 108/70 (BP Location: Right Arm, Patient Position: Sitting, Cuff Size: Normal)   Pulse 68   Ht 5\' 7"  (1.702 m)   Wt 125 lb 3.2 oz (56.8 kg)   LMP 08/01/2014 (Exact Date)   BMI 19.61  kg/m   Weight change: @WEIGHTCHANGE @ Height:   Height: 5\' 7"  (170.2 cm)  Ht Readings from Last 3 Encounters:  12/15/17 5\' 7"  (1.702 m)  09/21/17 5\' 6"  (1.676 m)  06/08/17 5\' 6"  (1.676 m)    General appearance: alert, cooperative and appears stated age Head: Normocephalic, without obvious abnormality, atraumatic Neck: no adenopathy, supple, symmetrical, trachea midline and thyroid normal to inspection and palpation Lungs: clear to auscultation bilaterally Cardiovascular: regular rate and rhythm Breasts: normal appearance, no masses or tenderness Abdomen: soft, non-tender; non distended,  no masses,  no organomegaly Extremities: extremities normal, atraumatic,  no cyanosis or edema Skin: Skin color, texture, turgor normal. No rashes or lesions Lymph nodes: Cervical, supraclavicular, and axillary nodes normal. No abnormal inguinal nodes palpated Neurologic: Grossly normal   Pelvic: External genitalia:  no lesions              Urethra:  normal appearing urethra with no masses, tenderness or lesions              Bartholins and Skenes: normal                 Vagina: mildly atrophic appearing vagina with normal color and discharge, no lesions              Cervix: no lesions               Bimanual Exam:  Uterus:  normal size, contour, position, consistency, mobility, non-tender, anteverted              Adnexa: no mass, fullness, tenderness               Rectovaginal: Confirms               Anus:  normal sphincter tone, no lesions  Chaperone was present for exam.  A:  Well Woman with normal exam  PMP  Fatigue, normal CBC in the last 6 months (no change in symptoms)    P:   FSH, TSH  TDAP  IFOB  Pap with hpv  We discussed mammogram, she isn't sure she wants to have one (aware of risks and benefits)  Discussed breast self exam  Discussed calcium and vit D intake  Other labs UTD with her primary

## 2017-12-15 NOTE — Patient Instructions (Signed)
EXERCISE AND DIET:  We recommended that you start or continue a regular exercise program for good health. Regular exercise means any activity that makes your heart beat faster and makes you sweat.  We recommend exercising at least 30 minutes per day at least 3 days a week, preferably 4 or 5.  We also recommend a diet low in fat and sugar.  Inactivity, poor dietary choices and obesity can cause diabetes, heart attack, stroke, and kidney damage, among others.    ALCOHOL AND SMOKING:  Women should limit their alcohol intake to no more than 7 drinks/beers/glasses of wine (combined, not each!) per week. Moderation of alcohol intake to this level decreases your risk of breast cancer and liver damage. And of course, no recreational drugs are part of a healthy lifestyle.  And absolutely no smoking or even second hand smoke. Most people know smoking can cause heart and lung diseases, but did you know it also contributes to weakening of your bones? Aging of your skin?  Yellowing of your teeth and nails?  CALCIUM AND VITAMIN D:  Adequate intake of calcium and Vitamin D are recommended.  The recommendations for exact amounts of these supplements seem to change often, but generally speaking 600 mg of calcium (either carbonate or citrate) and 800 units of Vitamin D per day seems prudent. Certain women may benefit from higher intake of Vitamin D.  If you are among these women, your doctor will have told you during your visit.    PAP SMEARS:  Pap smears, to check for cervical cancer or precancers,  have traditionally been done yearly, although recent scientific advances have shown that most women can have pap smears less often.  However, every woman still should have a physical exam from her gynecologist every year. It will include a breast check, inspection of the vulva and vagina to check for abnormal growths or skin changes, a visual exam of the cervix, and then an exam to evaluate the size and shape of the uterus and  ovaries.  And after 47 years of age, a rectal exam is indicated to check for rectal cancers. We will also provide age appropriate advice regarding health maintenance, like when you should have certain vaccines, screening for sexually transmitted diseases, bone density testing, colonoscopy, mammograms, etc.   MAMMOGRAMS:  All women over 40 years old should have a yearly mammogram. Many facilities now offer a "3D" mammogram, which may cost around $50 extra out of pocket. If possible,  we recommend you accept the option to have the 3D mammogram performed.  It both reduces the number of women who will be called back for extra views which then turn out to be normal, and it is better than the routine mammogram at detecting truly abnormal areas.    COLONOSCOPY:  Colonoscopy to screen for colon cancer is recommended for all women at age 50.  We know, you hate the idea of the prep.  We agree, BUT, having colon cancer and not knowing it is worse!!  Colon cancer so often starts as a polyp that can be seen and removed at colonscopy, which can quite literally save your life!  And if your first colonoscopy is normal and you have no family history of colon cancer, most women don't have to have it again for 10 years.  Once every ten years, you can do something that may end up saving your life, right?  We will be happy to help you get it scheduled when you are ready.    Be sure to check your insurance coverage so you understand how much it will cost.  It may be covered as a preventative service at no cost, but you should check your particular policy.     Mammogram Facilities  Yearly screening mammograms are recommended for women beginning at age 40. For a routine screening mammogram, you may schedule the appointment and have it done at the location of your choice.  Please ask the facility to send the results to our office. (fax [phone]) Location options include:  *[facility] [address], [address] [place], [place] [address] [phone]  [facility] [address], [address] , [place] [address] [phone]   Breast Self-Awareness Breast self-awareness means being familiar with how your breasts look and feel. It involves checking your breasts regularly and reporting any changes to your health care provider. Practicing breast self-awareness is important. A change in your breasts can be a sign of a serious medical problem. Being familiar with how your breasts look and feel allows you to find any problems early, when treatment is more likely to be successful. All women should practice breast self-awareness, including women who have had breast implants. How to do a breast self-exam One way to learn what is normal for your breasts and whether your breasts are changing is to do a breast self-exam. To do a breast self-exam: Look for Changes  1. Remove all the clothing above your waist. 2. Stand in front of a mirror in a room with good lighting. 3. Put your hands on your hips. 4. Push your hands firmly downward. 5. Compare your breasts in the mirror. Look for differences between them (asymmetry), such as: ? Differences in shape. ? Differences in size. ? Puckers, dips, and bumps in one breast and not the other. 6. Look at each breast for changes in your skin, such as: ? Redness. ? Scaly areas. 7. Look for changes in your nipples, such as: ? Discharge. ? Bleeding. ? Dimpling. ? Redness. ? A change in position. Feel for Changes  Carefully feel your breasts for lumps and changes. It is best to do this while lying on your back on the floor and again while sitting or standing in the shower or tub with soapy water on your skin. Feel each breast in the following way:  Place the arm on the side of the breast you are examining above your head.  Feel your breast with the other hand.  Start in the nipple area and make  inch (2 cm) overlapping circles to  feel your breast. Use the pads of your three middle fingers to do this. Apply light pressure, then medium pressure, then firm pressure. The light pressure will allow you to feel the tissue closest to the skin. The medium pressure will allow you to feel the tissue that is a little deeper. The firm pressure will allow you to feel the tissue close to the ribs.  Continue the overlapping circles, moving downward over the breast until you feel your ribs below your breast.  Move one finger-width toward the center of the body. Continue to use the  inch (2 cm) overlapping circles to feel your breast as you move slowly up toward your collarbone.  Continue the up and down exam using all three pressures until you reach your armpit.  Write Down What You Find  Write down what is normal for each breast and any changes that you find. Keep a written record   with breast changes or normal findings for each breast. By writing this information down, you do not need to depend only on memory for size, tenderness, or location. Write down where you are in your menstrual cycle, if you are still menstruating. If you are having trouble noticing differences in your breasts, do not get discouraged. With time you will become more familiar with the variations in your breasts and more comfortable with the exam. How often should I examine my breasts? Examine your breasts every month. If you are breastfeeding, the best time to examine your breasts is after a feeding or after using a breast pump. If you menstruate, the best time to examine your breasts is 5-7 days after your period is over. During your period, your breasts are lumpier, and it may be more difficult to notice changes. When should I see my health care provider? See your health care provider if you notice:  A change in shape or size of your breasts or nipples.  A change in the skin of your breast or nipples, such as a reddened or scaly area.  Unusual discharge from  your nipples.  A lump or thick area that was not there before.  Pain in your breasts.  Anything that concerns you.  This information is not intended to replace advice given to you by your health care provider. Make sure you discuss any questions you have with your health care provider. Document Released: 02/15/2005 Document Revised: 07/24/2015 Document Reviewed: 01/05/2015 Elsevier Interactive Patient Education  2018 Elsevier Inc.  

## 2017-12-16 LAB — FOLLICLE STIMULATING HORMONE: FSH: 66.7 m[IU]/mL

## 2017-12-16 LAB — TSH: TSH: 0.907 u[IU]/mL (ref 0.450–4.500)

## 2017-12-20 LAB — CYTOLOGY - PAP
Diagnosis: NEGATIVE
HPV: NOT DETECTED

## 2018-01-03 ENCOUNTER — Ambulatory Visit: Payer: Self-pay | Admitting: Gynecology

## 2018-01-13 ENCOUNTER — Telehealth: Payer: Self-pay | Admitting: Obstetrics and Gynecology

## 2018-01-13 LAB — FECAL OCCULT BLOOD, IMMUNOCHEMICAL: Fecal Occult Bld: NEGATIVE

## 2018-01-13 NOTE — Telephone Encounter (Signed)
Patient calling regarding test results   

## 2018-01-13 NOTE — Telephone Encounter (Signed)
Spoke with patient. Patient requesting Ifob results. Patient states she put IFOB in the mail on 01/05/18.   Advised patient results not back yet, our office will notify once received and reviewed by provider.   Routing to provider for final review. Patient is agreeable to disposition. Will close encounter.

## 2018-02-04 ENCOUNTER — Encounter (HOSPITAL_COMMUNITY): Payer: Self-pay | Admitting: Emergency Medicine

## 2018-02-04 ENCOUNTER — Emergency Department (HOSPITAL_COMMUNITY)
Admission: EM | Admit: 2018-02-04 | Discharge: 2018-02-05 | Disposition: A | Discharge status: Home / Self Care | Payer: BLUE CROSS/BLUE SHIELD | Attending: Emergency Medicine | Admitting: Emergency Medicine

## 2018-02-04 ENCOUNTER — Other Ambulatory Visit: Payer: Self-pay

## 2018-02-04 DIAGNOSIS — Z79899 Other long term (current) drug therapy: Secondary | ICD-10-CM | POA: Insufficient documentation

## 2018-02-04 DIAGNOSIS — R6884 Jaw pain: Secondary | ICD-10-CM | POA: Diagnosis present

## 2018-02-04 DIAGNOSIS — F419 Anxiety disorder, unspecified: Secondary | ICD-10-CM | POA: Diagnosis not present

## 2018-02-04 NOTE — ED Triage Notes (Signed)
Pt arriving via [facility] for anxiety from night terrors. Pt reports chest tightness and bilateral jaw pain. Pt describes pain in jaw as being "tense" as if she had been clenching her jaw for a long time. Pt reports she is not on anxiety medications.

## 2018-02-04 NOTE — ED Notes (Signed)
Bed: WTR5 Expected date:  Expected time:  Means of arrival:  Comments: 

## 2018-02-05 NOTE — ED Provider Notes (Signed)
[facility]-EMERGENCY DEPT Provider Note   CSN: [id number] Arrival date & time: 02/04/18  2227     History   Chief Complaint Chief Complaint  Patient presents with  . Anxiety    HPI Ashley [name] is a 47 y.o. female.   47 y/o female with hx of anxiety presents to the ED for worsening anxiety tonight.  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  Awoke tonight following a night terror with some tightness in her chest as well as bilateral jaw discomfort.  She called EMS and had persistent symptoms with some, so opted to be transferred to the hospital for evaluation.  Since arriving and her anxiety has improved, she states that her symptoms have lessened.  Describes her jaw discomfort as being "tense", as if she had been clenching her jaw for an extended period of time.  Is not actively being followed for her anxiety.  Has had some increased stress at work.  Denies being on any medications currently.  The history is provided by the patient. No language interpreter was used.  Anxiety     Past Medical History:  Diagnosis Date  . Anxiety   . ASCUS favor benign 04/2013   Negative high risk HPV screen. Recommend repeat Pap smear/HPV one year  . 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 Endometrial polyp 04/2013   Removed in the office extruding from the cervical os  . Hormone disorder   . Migraines   . Urinary incontinence     Patient Active Problem List   Diagnosis Date Noted  . Hyponatremia 09/21/2017  . Abdominal pain 09/21/2017  . Lack of menses 09/21/2017  . Anxiety   . Migraines   . Dysplasia of cervix, low grade (CIN 1) 09/29/2015    Past Surgical History:  Procedure Laterality Date  . DILATATION & CURETTAGE/HYSTEROSCOPY WITH TRUECLEAR N/A 09/18/2013   Procedure: DILATATION & CURETTAGE/HYSTEROSCOPY WITH TRUCLEAR;  Surgeon: Dara Lords, MD;  Location: [facility];  Service:  Gynecology;  Laterality: N/A;  . WISDOM TOOTH EXTRACTION Bilateral      OB History    Gravida  0   Para  0   Term  0   Preterm  0   AB  0   Living  0     SAB  0   TAB  0   Ectopic  0   Multiple  0   Live Births  0            Home Medications    Prior to Admission medications   Medication Sig Start Date End Date Taking? Authorizing Provider  GARLIC PO Take 1 capsule by mouth daily.    [provider]  MAGNESIUM PO Take 1 tablet by mouth daily.    [provider]  Multiple Vitamins-Minerals (MULTIVITAMIN WITH MINERALS) tablet Take 1 tablet by mouth daily.    [provider]  ondansetron (ZOFRAN) 8 MG tablet Take 1 tablet (8 mg total) by mouth every 8 (eight) hours as needed for nausea or vomiting. 06/08/17   Mliss Sax, MD  TURMERIC PO Take 1 capsule by mouth daily.    [provider]  zolmitriptan (ZOMIG-ZMT) 5 MG disintegrating tablet Take 1 tablet (5 mg total) by mouth as needed for migraine. 06/08/17   Mliss Sax, MD    Family History Family History  Problem Relation Age of Onset  .  Bipolar disorder Mother   . Mental illness Mother   . Parkinsonism Father   . Hypertension Father   . Cancer Paternal Grandmother        Breast    Social History Social History   Tobacco Use  . Smoking status: Never Smoker  . Smokeless tobacco: Never Used  Substance Use Topics  . Alcohol use: Yes    Comment: occ  . Drug use: No     Allergies   Codeine   Review of Systems Review of Systems Ten systems reviewed and are negative for acute change, except as noted in the HPI.    Physical Exam Updated Vital Signs BP 108/65 (BP Location: Right Arm)   Pulse 71   Temp 98 F (36.7 C) (Oral)   Resp 16   Ht 5\' 7"  (1.702 m)   Wt 58.1 kg   LMP 08/01/2014 (Exact Date)   SpO2 97%   BMI 20.05 kg/m   Physical Exam  Constitutional: She is oriented to person, place, and time. She appears well-developed and  well-nourished. No distress.  Anxious appearing, in NAD  HENT:  Head: Normocephalic and atraumatic.  Eyes: Conjunctivae and EOM are normal. No scleral icterus.  Neck: Normal range of motion.  Cardiovascular: Normal rate, regular rhythm and intact distal pulses.  Pulmonary/Chest: Effort normal. No stridor. No respiratory distress. She has no wheezes. She has no rales.  Lungs CTAB. Respirations even and unlabored.  Musculoskeletal: Normal range of motion.  Neurological: She is alert and oriented to person, place, and time.  Skin: Skin is warm and dry. No rash noted. She is not diaphoretic. No erythema. No pallor.  Psychiatric: Her mood appears anxious. She is withdrawn.  Stuttering words at times.  Nursing note and vitals reviewed.    ED Treatments / Results  Labs (all labs ordered are listed, but only abnormal results are displayed) Labs Reviewed - No data to display  EKG EKG Interpretation  Date/Time:  Sunday February 05 2018 00:30:31 EST Ventricular Rate:  72 PR Interval:    QRS Duration: 102 QT Interval:  384 QTC Calculation: 421 R Axis:   95 Text Interpretation:  Sinus rhythm Consider right atrial enlargement Consider right ventricular hypertrophy ST elev, probable normal early repol pattern No significant change since last tracing Confirmed by Cardama, Pedro ([id number]) on 02/05/2018 12:47:29 AM   Radiology No results found.  Procedures Procedures (including critical care time)  Medications Ordered in ED Medications - No data to display   Initial Impression / Assessment and Plan / ED Course  I have reviewed the triage vital signs and the nursing notes.  Pertinent labs & imaging results that were available during my care of the patient were reviewed by me and considered in my medical decision making (see chart for details).     47  year old female with a history of anxiety presenting for chest tightness and jaw discomfort after waking from sleep following a night  tear and increasingly anxious state.  She is visibly anxious on my assessment with some tremors and stuttering to her speech.  She has been hemodynamically stable with reassuring vital signs.  Her chest discomfort and jaw aching have subsided with lessening of anxiety.  EKG performed which is unchanged from prior.  Low suspicion for cardiac etiology.  The patient is low risk for ACS, only with FHx of MI in mother at an older age.  Symptoms consistent with panic attack for which patient can follow-up with her primary doctor.  She may  benefit from initiation of prescription medications by her PCP.  Return precautions discussed and provided.  Patient discharged in stable condition with no unaddressed concerns.  Vitals:   02/04/18 2229 02/04/18 2240 02/04/18 2242 02/05/18 0032  BP:  103/69  108/65  Pulse:  80  71  Resp:  16  16  Temp:  98 F (36.7 C)    TempSrc:  Oral    SpO2: 99% 100%  97%  Weight:   58.1 kg   Height:   5\' 7"  (1.702 m)     Final Clinical Impressions(s) / ED Diagnoses   Final diagnoses:  Anxiety    ED Discharge Orders    None       Antony Madura, PA-C 02/05/18 0151    Nira Conn, MD 02/05/18 [phone]

## 2018-02-05 NOTE — Discharge Instructions (Addendum)
We advise follow-up with your primary care doctor regarding your worsening anxiety and difficulty sleeping.  You may return to the ED for new or concerning symptoms.

## 2018-05-11 IMAGING — CT CT HEAD W/O CM
4 series · 16 of 47 positions shown, 18 images · non-contrast
Comparison: None.

CLINICAL DATA: Worsening dizziness for 6 days.  Slight headache.

EXAM:
CT HEAD WITHOUT CONTRAST
TECHNIQUE: Contiguous axial images were obtained from the base of the skull
through the vertex without intravenous contrast.

[Series 3: head without · axial · non-contrast · 0.43mm/px · z∈[-68,+52]mm · 7 of 34 slices shown, 9 images]
[im 5/34  brain]
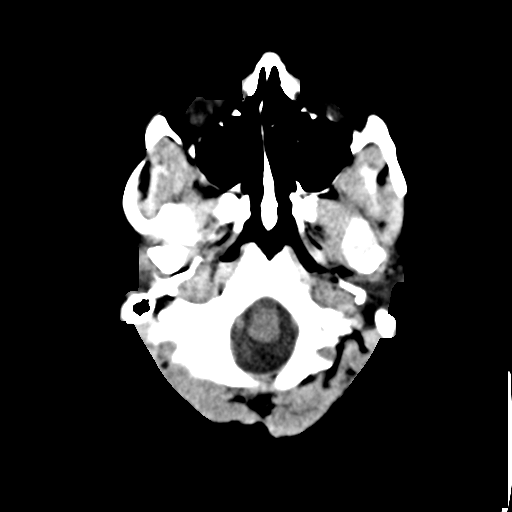
[im 5/34  bone]
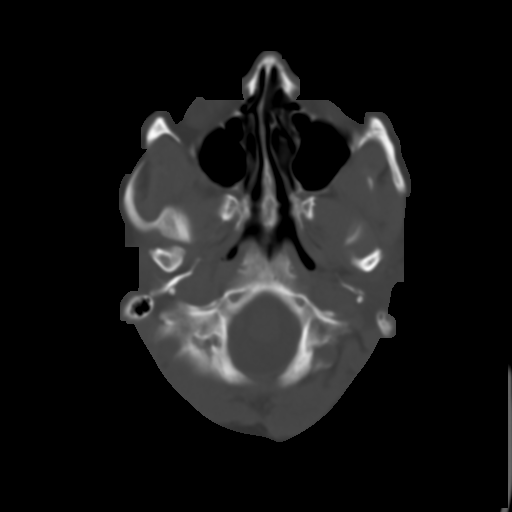
[im 9/34  brain]
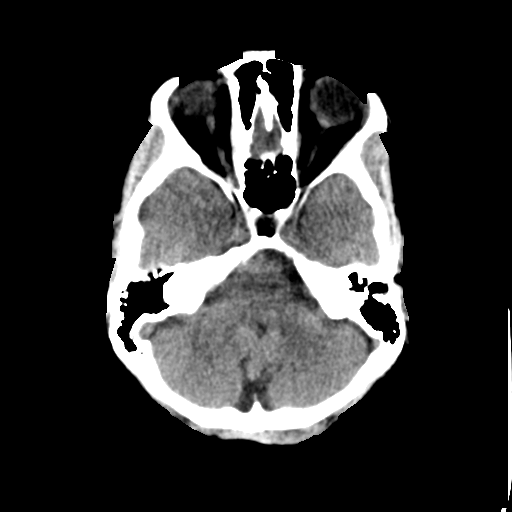
[im 13/34  brain]
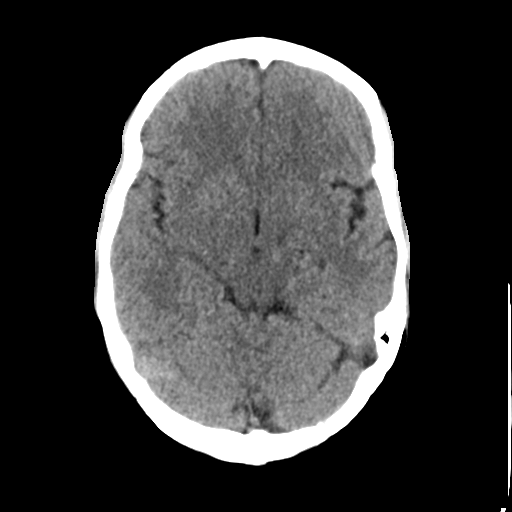
[im 17/34  brain]
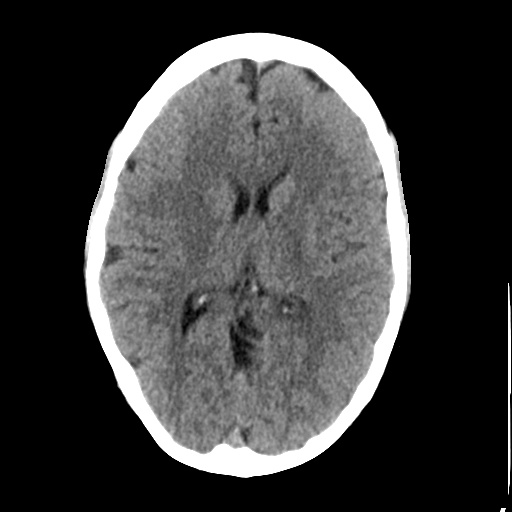
[im 21/34  brain]
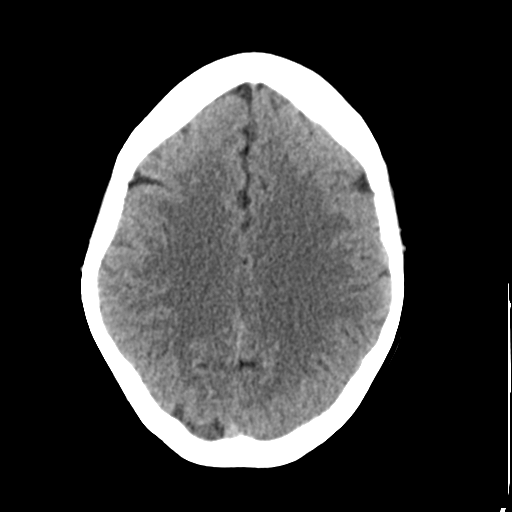
[im 21/34  bone]
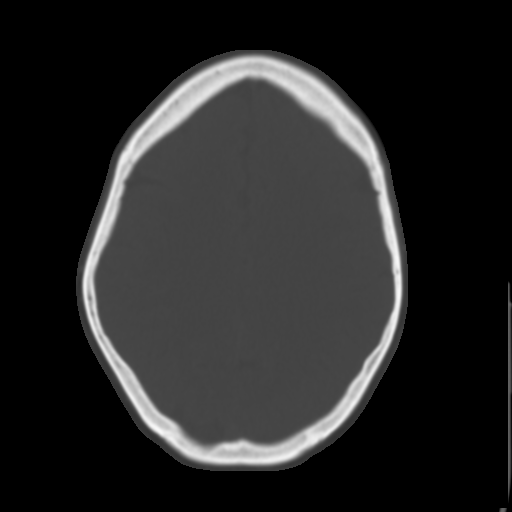
[im 25/34  brain]
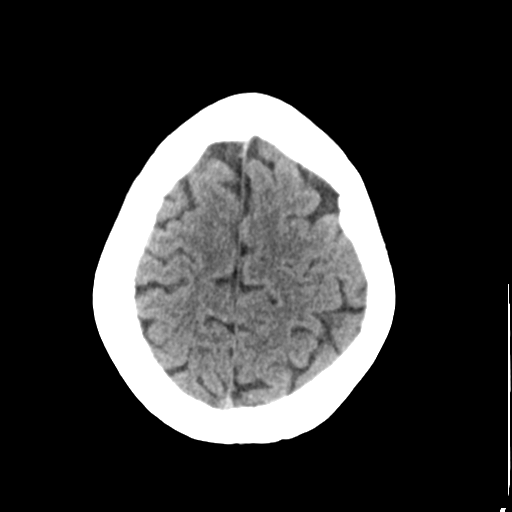
[im 29/34  brain]
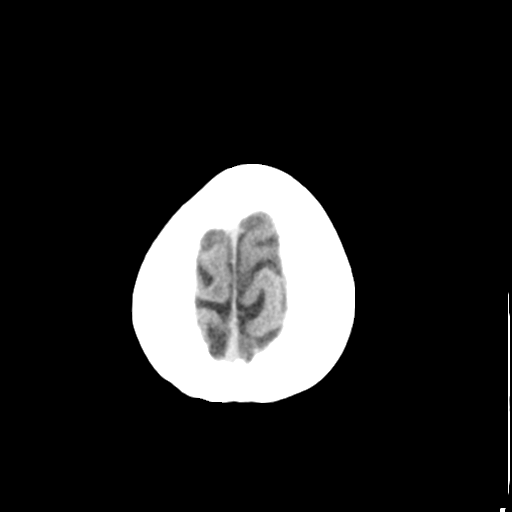

[Series 4: head bone · axial · 0.43mm/px · z∈[-72,-40]mm · 3 of 84 slices shown]
[im 9/84  bone]
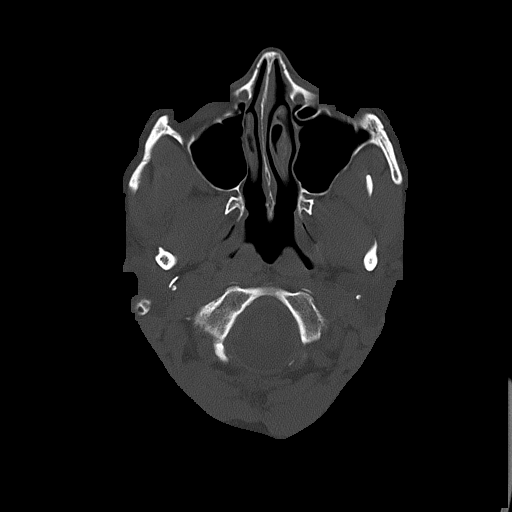
[im 17/84  bone]
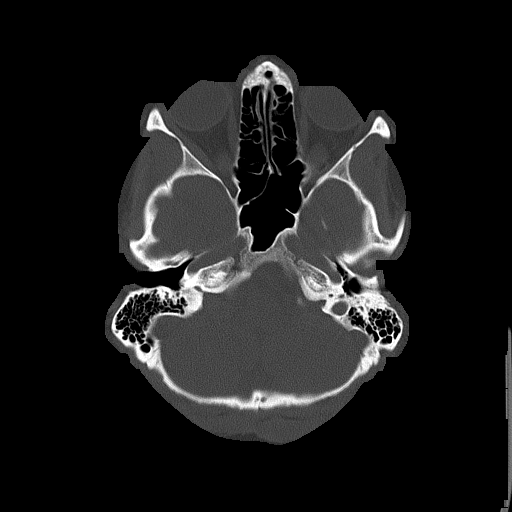
[im 25/84  bone]
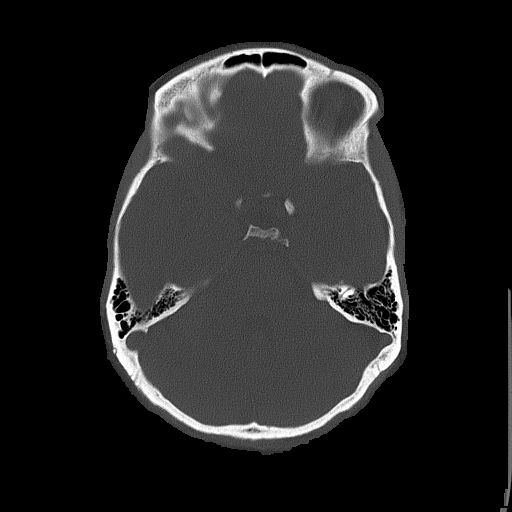

[Series 5: head without cor · coronal · non-contrast · 0.32mm/px · 3 of 69 slices shown]
[im 23/69  brain]
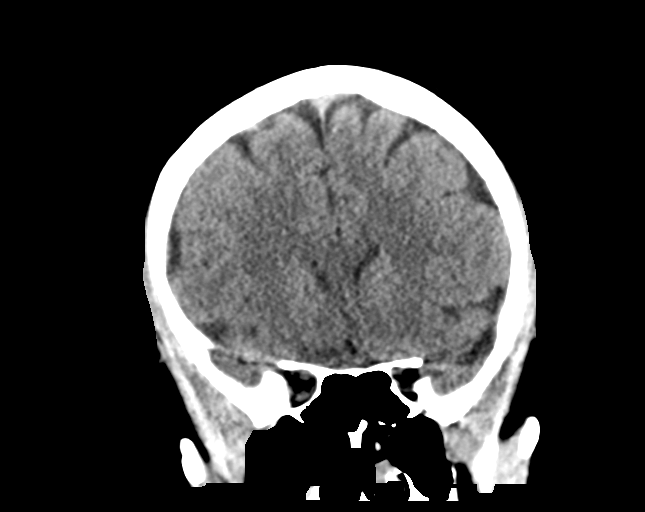
[im 31/69  brain]
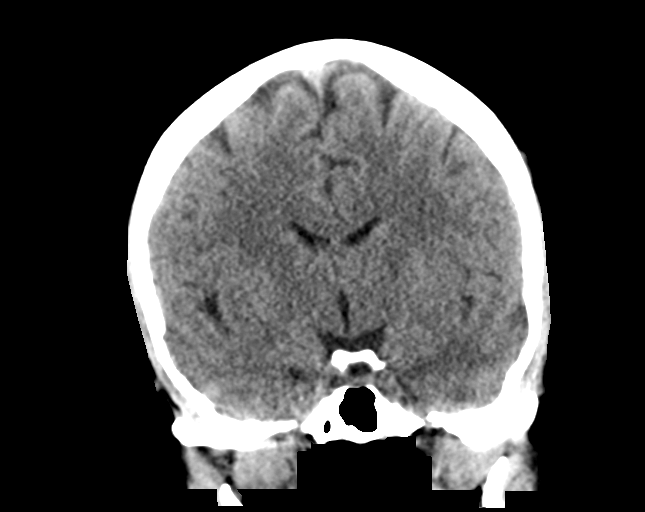
[im 38/69  brain]
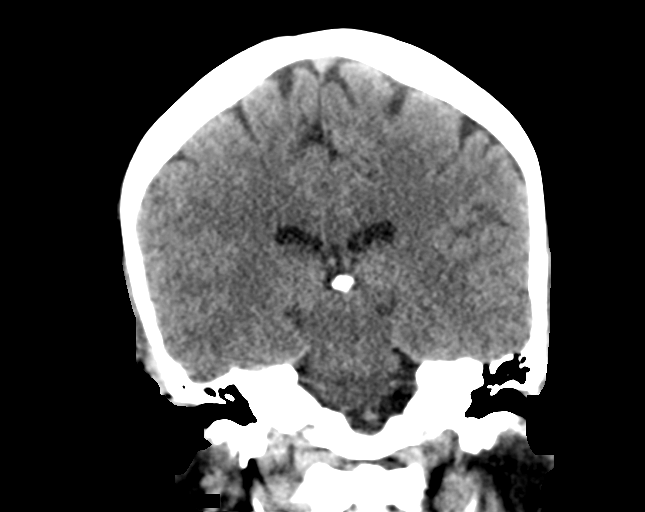

[Series 6: head without sag · sagittal · non-contrast · 0.32mm/px · 3 of 67 slices shown]
[im 23/67  brain]
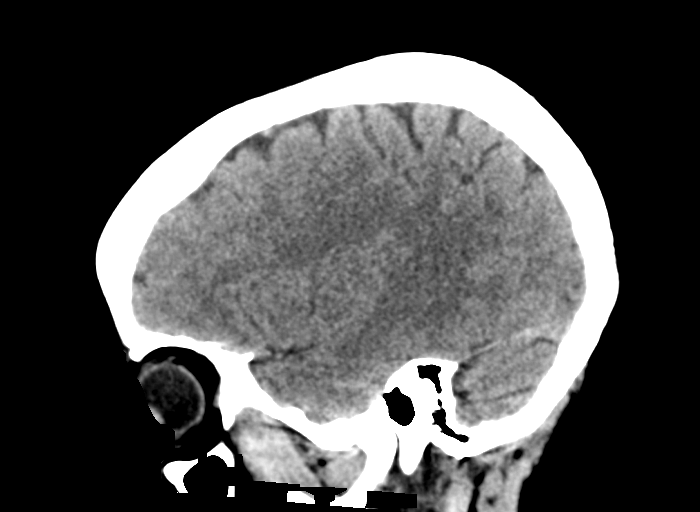
[im 34/67  brain]
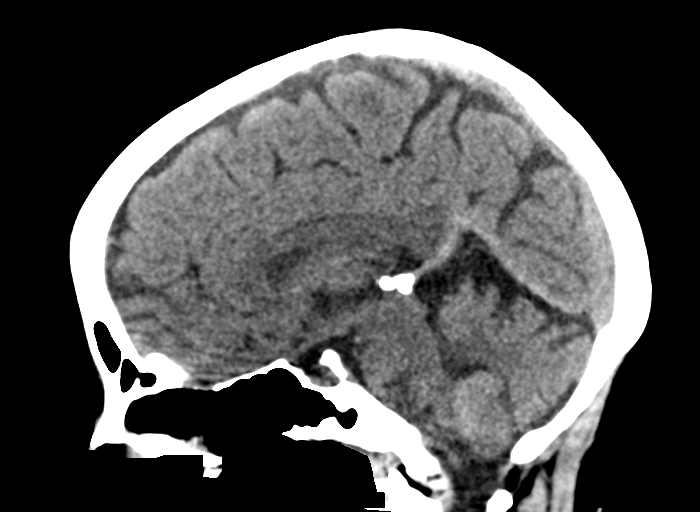
[im 45/67  brain]
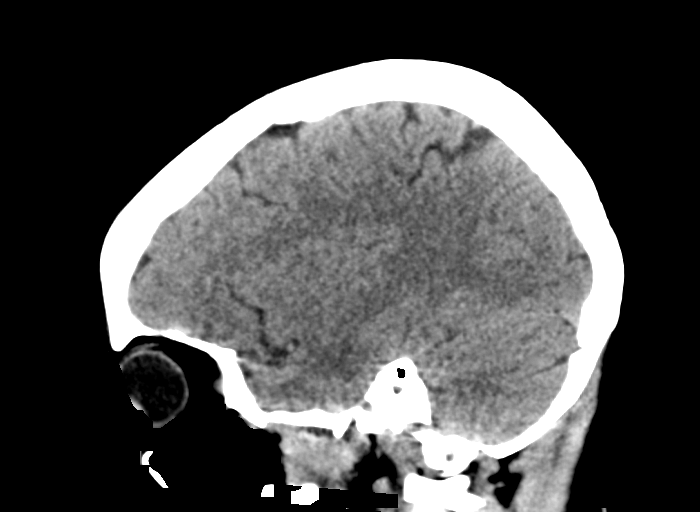

[16 of 47 positions shown; findings below may reference images not displayed]

FINDINGS: BRAIN: No intraparenchymal hemorrhage, mass effect nor midline
shift. The ventricles and sulci are normal. No acute large vascular
territory infarcts. No abnormal extra-axial fluid collections. Basal
cisterns are patent.

VASCULAR: Unremarkable.

SKULL/SOFT TISSUES: No skull fracture. Mild LEFT temporomandibular
osteoarthrosis. No significant soft tissue swelling.

ORBITS/SINUSES: The included ocular globes and orbital contents are
normal.RIGHT maxillary mucosal retention cysts without paranasal
sinus air-fluid levels. Mastoid air cells are well aerated.

OTHER: None.
IMPRESSION: Normal noncontrast CT HEAD.

## 2018-07-31 ENCOUNTER — Encounter: Payer: Self-pay | Admitting: Physician Assistant

## 2018-07-31 ENCOUNTER — Telehealth: Payer: BLUE CROSS/BLUE SHIELD | Admitting: Physician Assistant

## 2018-07-31 DIAGNOSIS — G4489 Other headache syndrome: Secondary | ICD-10-CM

## 2018-07-31 DIAGNOSIS — R05 Cough: Secondary | ICD-10-CM

## 2018-07-31 DIAGNOSIS — J029 Acute pharyngitis, unspecified: Secondary | ICD-10-CM

## 2018-07-31 DIAGNOSIS — R059 Cough, unspecified: Secondary | ICD-10-CM

## 2018-07-31 MED ORDER — BENZONATATE 100 MG PO CAPS: 100.0000 mg | ORAL_CAPSULE | Freq: Two times a day (BID) | ORAL | 0 refills | Status: DC | PRN

## 2018-07-31 MED FILL — BENZONATATE 100 MG CAPS: 100 | 10 days supply | Qty: 20 | Fill #0

## 2018-07-31 NOTE — Progress Notes (Signed)
E-Visit for Corona Virus Screening   Based on your current symptoms, it seems unlikely that your symptoms are related to the Coronavirus.    I have provided a work note for the next 2 days to assess your symptoms. If your symptoms persist, worsen, or if you develop any new symptoms, submit another Evisit or follow up with your doctor.  You have been enrolled in MyChart Home Monitoring for COVID-19. Daily you will receive a questionnaire within the MyChart website. Our COVID-19 response team will be monitoring your responses daily.   Coronavirus disease 2019 (COVID-19) is a respiratory illness that can spread from person to person. The virus that causes COVID-19 is a new virus that was first identified in the country of [place] but is now found in multiple other countries and has spread to the [place].  Symptoms associated with the virus are mild to severe fever, cough, and shortness of breath. There is currently no vaccine to protect against COVID-19, and there is no specific antiviral treatment for the virus.   To be considered HIGH RISK for Coronavirus (COVID-19), you have to meet the following criteria:  . Traveled to [place], [place], [place], [place] or [place]; or in the [place] to [place], [place], [place], or [place]; and have fever, cough, and shortness of breath within the last 2 weeks of travel OR  . Been in close contact with a person diagnosed with COVID-19 within the last 2 weeks and have fever, cough, and shortness of breath  . IF YOU DO NOT MEET THESE CRITERIA, YOU ARE CONSIDERED LOW RISK FOR COVID-19.   It is vitally important that if you feel that you have an infection such as this virus or any other virus that you stay home and away from places where you may spread it to others.  You should self-quarantine for 14 days if you have symptoms that could potentially be coronavirus and avoid contact with people age 48 and older.   You can use medication such as A  prescription cough medication called Tessalon Perles 100 mg. You may take 1-2 capsules every 8 hours as needed for cough  You may also take acetaminophen (Tylenol) as needed for fever.   Reduce your risk of any infection by using the same precautions used for avoiding the common cold or flu:  Marland Kitchen. Wash your hands often with soap and warm water for at least 20 seconds.  If soap and water are not readily available, use an alcohol-based hand sanitizer with at least 60% alcohol.  . If coughing or sneezing, cover your mouth and nose by coughing or sneezing into the elbow areas of your shirt or coat, into a tissue or into your sleeve (not your hands). . Avoid shaking hands with others and consider head nods or verbal greetings only. . Avoid touching your eyes, nose, or mouth with unwashed hands.  . Avoid close contact with people who are sick. . Avoid places or events with large numbers of people in one location, like concerts or sporting events. . Carefully consider travel plans you have or are making. . If you are planning any travel outside or inside the [place], visit the CDC's Travelers' Health webpage for the latest health notices. . If you have some symptoms but not all symptoms, continue to monitor at home and seek medical attention if your symptoms worsen. . If you are having a medical emergency, call 911.  HOME CARE . Only take medications as instructed by  your medical team. . Drink plenty of fluids and get plenty of rest. . A steam or ultrasonic humidifier can help if you have congestion.   GET HELP RIGHT AWAY IF: . You develop worsening fever. . You become short of breath . You cough up blood. . Your symptoms become more severe MAKE SURE YOU   Understand these instructions.  Will watch your condition.  Will get help right away if you are not doing well or get worse.  Your e-visit answers were reviewed by a board certified advanced clinical practitioner to complete your personal care  plan.  Depending on the condition, your plan could have included both over the counter or prescription medications.  If there is a problem please reply once you have received a response from your provider. Your safety is important to [place].  If you have drug allergies check your prescription carefully.    You can use MyChart to ask questions about today's visit, request a non-urgent call back, or ask for a work or school excuse for 24 hours related to this e-Visit. If it has been greater than 24 hours you will need to follow up with your provider, or enter a new e-Visit to address those concerns. You will get an e-mail in the next two days asking about your experience.  I hope that your e-visit has been valuable and will speed your recovery. Thank you for using e-visits.   I spent 5-10 minutes on review and completion of this note- [facility]

## 2018-09-25 ENCOUNTER — Other Ambulatory Visit: Payer: Self-pay

## 2018-09-25 ENCOUNTER — Emergency Department (HOSPITAL_COMMUNITY)
Admission: EM | Admit: 2018-09-25 | Discharge: 2018-09-25 | Disposition: A | Discharge status: Home / Self Care | Payer: BLUE CROSS/BLUE SHIELD | Attending: Emergency Medicine | Admitting: Emergency Medicine

## 2018-09-25 ENCOUNTER — Encounter (HOSPITAL_COMMUNITY): Payer: Self-pay

## 2018-09-25 DIAGNOSIS — Z79899 Other long term (current) drug therapy: Secondary | ICD-10-CM | POA: Diagnosis not present

## 2018-09-25 DIAGNOSIS — R112 Nausea with vomiting, unspecified: Secondary | ICD-10-CM | POA: Diagnosis not present

## 2018-09-25 DIAGNOSIS — R109 Unspecified abdominal pain: Secondary | ICD-10-CM | POA: Diagnosis not present

## 2018-09-25 LAB — URINALYSIS, ROUTINE W REFLEX MICROSCOPIC
Bilirubin Urine: NEGATIVE
Glucose, UA: NEGATIVE mg/dL
Hgb urine dipstick: NEGATIVE
Ketones, ur: NEGATIVE mg/dL
Leukocytes,Ua: NEGATIVE
Nitrite: NEGATIVE
Protein, ur: NEGATIVE mg/dL
Specific Gravity, Urine: 1.009 (ref 1.005–1.030)
pH: 6 (ref 5.0–8.0)

## 2018-09-25 LAB — RAPID URINE DRUG SCREEN, HOSP PERFORMED
Amphetamines: NOT DETECTED
Barbiturates: NOT DETECTED
Benzodiazepines: NOT DETECTED
Cocaine: NOT DETECTED
Opiates: NOT DETECTED
Tetrahydrocannabinol: NOT DETECTED

## 2018-09-25 LAB — COMPREHENSIVE METABOLIC PANEL
ALT: 36 U/L (ref 0–44)
AST: 28 U/L (ref 15–41)
Albumin: 4.3 g/dL (ref 3.5–5.0)
Alkaline Phosphatase: 101 U/L (ref 38–126)
Anion gap: 11 (ref 5–15)
BUN: 14 mg/dL (ref 6–20)
CO2: 25 mmol/L (ref 22–32)
Calcium: 9 mg/dL (ref 8.9–10.3)
Chloride: 98 mmol/L (ref 98–111)
Creatinine, Ser: 1 mg/dL (ref 0.44–1.00)
GFR calc Af Amer: >60 mL/min (ref >60)
GFR calc non Af Amer: >60 mL/min (ref >60)
Glucose, Bld: 105 mg/dL — ABNORMAL HIGH (ref 70–99)
Potassium: 3.4 mmol/L — ABNORMAL LOW (ref 3.5–5.1)
Sodium: 134 mmol/L — ABNORMAL LOW (ref 135–145)
Total Bilirubin: 1.6 mg/dL — ABNORMAL HIGH (ref 0.3–1.2)
Total Protein: 7.3 g/dL (ref 6.5–8.1)

## 2018-09-25 LAB — CBC WITH DIFFERENTIAL/PLATELET
Abs Immature Granulocytes: 0.01 10*3/uL (ref 0.00–0.07)
Basophils Absolute: 0 10*3/uL (ref 0.0–0.1)
Basophils Relative: 0 %
Eosinophils Absolute: 0 10*3/uL (ref 0.0–0.5)
Eosinophils Relative: 0 %
HCT: 39.9 % (ref 36.0–46.0)
Hemoglobin: 13.4 g/dL (ref 12.0–15.0)
Immature Granulocytes: 0 %
Lymphocytes Relative: 5 %
Lymphs Abs: 0.2 10*3/uL — ABNORMAL LOW (ref 0.7–4.0)
MCH: 31.8 pg (ref 26.0–34.0)
MCHC: 33.6 g/dL (ref 30.0–36.0)
MCV: 94.8 fL (ref 80.0–100.0)
Monocytes Absolute: 0 10*3/uL — ABNORMAL LOW (ref 0.1–1.0)
Monocytes Relative: 0 %
Neutro Abs: 3.2 10*3/uL (ref 1.7–7.7)
Neutrophils Relative %: 95 %
Platelets: 105 10*3/uL — ABNORMAL LOW (ref 150–400)
RBC: 4.21 MIL/uL (ref 3.87–5.11)
RDW: 11.9 % (ref 11.5–15.5)
WBC: 3.4 10*3/uL — ABNORMAL LOW (ref 4.0–10.5)
nRBC: 0 % (ref 0.0–0.2)

## 2018-09-25 LAB — LIPASE, BLOOD: Lipase: 32 U/L (ref 11–51)

## 2018-09-25 LAB — HCG, QUANTITATIVE, PREGNANCY: hCG, Beta Chain, Quant, S: 5 m[IU]/mL — ABNORMAL HIGH (ref <5)

## 2018-09-25 MED ORDER — FAMOTIDINE IN NACL 20-0.9 MG/50ML-% IV SOLN
20.0000 mg | Freq: Once | INTRAVENOUS | Status: AC
  Administered 2018-09-25: 20 mg via INTRAVENOUS
  Filled 2018-09-25: qty 50

## 2018-09-25 MED ORDER — HYOSCYAMINE SULFATE SL 0.125 MG SL SUBL: 0.1250 | SUBLINGUAL_TABLET | Freq: Three times a day (TID) | SUBLINGUAL | 0 refills | Status: DC | PRN

## 2018-09-25 MED ORDER — FAMOTIDINE 20 MG PO TABS: 20.0000 mg | ORAL_TABLET | Freq: Two times a day (BID) | ORAL | 0 refills | Status: DC

## 2018-09-25 MED ORDER — SODIUM CHLORIDE 0.9 % IV BOLUS
1000.0000 mL | Freq: Once | INTRAVENOUS | Status: AC
  Administered 2018-09-25: 13:00:00 1000 mL via INTRAVENOUS

## 2018-09-25 MED ORDER — PROMETHAZINE HCL 25 MG PO TABS: 25.0000 mg | ORAL_TABLET | Freq: Four times a day (QID) | ORAL | 0 refills | Status: DC | PRN

## 2018-09-25 MED ORDER — ONDANSETRON HCL 4 MG/2ML IJ SOLN
4.0000 mg | Freq: Once | INTRAMUSCULAR | Status: AC
  Administered 2018-09-25: 4 mg via INTRAVENOUS
  Filled 2018-09-25: qty 2

## 2018-09-25 MED FILL — SM ACID REDUCER 20 MG TAB: 20 | 12 days supply | Qty: 25 | Fill #0

## 2018-09-25 MED FILL — OSCIMIN SL 0.125 MG TABLET: 0.125 | 5 days supply | Qty: 15 | Fill #0

## 2018-09-25 MED FILL — PROMETHAZINE 25 MG TABLET: 25 | 5 days supply | Qty: 20 | Fill #0

## 2018-09-25 NOTE — ED Provider Notes (Signed)
[facility]-EMERGENCY DEPT Provider Note   CSN: [id number] Arrival date & time: 09/25/18  1232    History   Chief Complaint Chief Complaint  Patient presents with  . Nausea  . Abdominal Pain  . Emesis    HPI Ashley [name] is a 48 y.o. female.     HPI Patient gives a somewhat vague history of nausea and vomiting with abdominal pain.  She reports for a week she has not really felt very good.  She has had some intermittent episodes of vomiting that started yesterday.  She reports she does feel nauseated and tried some Zofran but got minimal relief.  She indicates some diffuse lower abdominal discomfort.  Patient denies pain burning urgency with urination.  She reports she is occasionally sexually active with a female partner.  Last sexual activity about a month ago.  She denies abnormal vaginal bleeding or discharge.  She does identify she has some similar abdominal pain about a year or more ago and after some episodes of vomiting and just resolved.  Reports she does have some problems with migraines periodically.  No significant headache at this time.  Reports she did take some migraine medication a few days ago. Past Medical History:  Diagnosis Date  . Anxiety   . ASCUS favor benign 04/2013   Negative high risk HPV screen. Recommend repeat Pap smear/HPV one year  . 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 Endometrial polyp 04/2013   Removed in the office extruding from the cervical os  . Hormone disorder   . Migraines   . Urinary incontinence     Patient Active Problem List   Diagnosis Date Noted  . Hyponatremia 09/21/2017  . Abdominal pain 09/21/2017  . Lack of menses 09/21/2017  . Anxiety   . Migraines   . Dysplasia of cervix, low grade (CIN 1) 09/29/2015    Past Surgical History:  Procedure Laterality Date  . DILATATION & CURETTAGE/HYSTEROSCOPY WITH TRUECLEAR N/A 09/18/2013   Procedure: DILATATION & CURETTAGE/HYSTEROSCOPY WITH  TRUCLEAR;  Surgeon: Dara Lordsimothy P Fontaine, MD;  Location: [facility];  Service: Gynecology;  Laterality: N/A;  . WISDOM TOOTH EXTRACTION Bilateral      OB History    Gravida  0   Para  0   Term  0   Preterm  0   AB  0   Living  0     SAB  0   TAB  0   Ectopic  0   Multiple  0   Live Births  0            Home Medications    Prior to Admission medications   Medication Sig Start Date End Date Taking? Authorizing Provider  acetaminophen (TYLENOL) 325 MG tablet Take 650 mg by mouth every 6 (six) hours as needed for mild pain or headache.   Yes [provider]  CRANBERRY PO Take 1 tablet by mouth daily.   Yes [provider]  GARLIC PO Take 1 tablet by mouth every other day.   Yes [provider]  Magnesium 200 MG TABS Take 200 mg by mouth at bedtime.   Yes [provider]  Multiple Vitamin (MULTIVITAMIN WITH MINERALS) TABS tablet Take 1 tablet by mouth daily.   Yes [provider]  ondansetron (ZOFRAN) 8 MG tablet Take 1 tablet (8 mg total) by mouth every 8 (eight) hours as needed for nausea or vomiting. 06/08/17  Yes Mliss SaxKremer, William Alfred, MD  TURMERIC PO Take 1 capsule by mouth every other day.    Yes [provider]  zolmitriptan (ZOMIG-ZMT) 5 MG disintegrating tablet Take 1 tablet (5 mg total) by mouth as needed for migraine. 06/08/17  Yes Mliss SaxKremer, William Alfred, MD  benzonatate (TESSALON) 100 MG capsule Take 1 capsule (100 mg total) by mouth 2 (two) times daily as needed for cough. Patient not taking: Reported on 09/25/2018 07/31/18   Demetrio Lappingsman, Sahar M, PA-C  famotidine (PEPCID) 20 MG tablet Take 1 tablet (20 mg total) by mouth 2 (two) times daily. 09/25/18   Arby BarrettePfeiffer, Billye Pickerel, MD  Hyoscyamine Sulfate SL (LEVSIN/SL) 0.125 MG SUBL Place 0.125 tablets under the tongue 3 (three) times daily as needed. 09/25/18   Arby BarrettePfeiffer, Hamp Moreland, MD  promethazine (PHENERGAN) 25 MG tablet Take 1 tablet (25 mg total) by mouth every 6 (six) hours as  needed for nausea or vomiting. 09/25/18   Arby BarrettePfeiffer, Isamar Wellbrock, MD    Family History Family History  Problem Relation Age of Onset  . Bipolar disorder Mother   . Mental illness Mother   . Parkinsonism Father   . Hypertension Father   . Cancer Paternal Grandmother        Breast    Social History Social History   Tobacco Use  . Smoking status: Never Smoker  . Smokeless tobacco: Never Used  Substance Use Topics  . Alcohol use: Yes    Comment: occ  . Drug use: No     Allergies   Codeine   Review of Systems Review of Systems 10 Systems reviewed and are negative for acute change except as noted in the HPI.   Physical Exam Updated Vital Signs BP 96/64   Pulse 89   Temp 99.5 F (37.5 C) (Oral)   Resp 18   LMP 08/01/2014 (Exact Date)   SpO2 100%   Physical Exam Constitutional:      Appearance: She is well-developed.  HENT:     Head: Normocephalic and atraumatic.  Eyes:     Pupils: Pupils are equal, round, and reactive to light.  Neck:     Musculoskeletal: Neck supple.  Cardiovascular:     Rate and Rhythm: Normal rate and regular rhythm.     Heart sounds: Normal heart sounds.  Pulmonary:     Effort: Pulmonary effort is normal.     Breath sounds: Normal breath sounds.  Abdominal:     General: Bowel sounds are normal. There is no distension.     Palpations: Abdomen is soft.     Tenderness: There is no abdominal tenderness.  Musculoskeletal: Normal range of motion.  Skin:    General: Skin is warm and dry.  Neurological:     Mental Status: She is alert and oriented to person, place, and time.     GCS: GCS eye subscore is 4. GCS verbal subscore is 5. GCS motor subscore is 6.     Coordination: Coordination normal.      ED Treatments / Results  Labs (all labs ordered are listed, but only abnormal results are displayed) Labs Reviewed  COMPREHENSIVE METABOLIC PANEL - Abnormal; Notable for the following components:      Result Value   Sodium 134 (*)     Potassium 3.4 (*)    Glucose, Bld 105 (*)    Total Bilirubin 1.6 (*)    All other components within normal limits  CBC WITH DIFFERENTIAL/PLATELET - Abnormal; Notable for the following components:   WBC 3.4 (*)    Platelets 105 (*)  Lymphs Abs 0.2 (*)    Monocytes Absolute 0.0 (*)    All other components within normal limits  HCG, QUANTITATIVE, PREGNANCY - Abnormal; Notable for the following components:   hCG, Beta Chain, Quant, S 5 (*)    All other components within normal limits  LIPASE, BLOOD  URINALYSIS, ROUTINE W REFLEX MICROSCOPIC  RAPID URINE DRUG SCREEN, HOSP PERFORMED  RPR  HIV ANTIBODY (ROUTINE TESTING W REFLEX)  GC/CHLAMYDIA PROBE AMP ([place]) NOT AT [facility]    EKG None  Radiology No results found.  Procedures Procedures (including critical care time)  Medications Ordered in ED Medications  sodium chloride 0.9 % bolus 1,000 mL (0 mLs Intravenous Stopped 09/25/18 1444)  famotidine (PEPCID) IVPB 20 mg premix (0 mg Intravenous Stopped 09/25/18 1356)  ondansetron (ZOFRAN) injection 4 mg (4 mg Intravenous Given 09/25/18 1320)     Initial Impression / Assessment and Plan / ED Course  I have reviewed the triage vital signs and the nursing notes.  Pertinent labs & imaging results that were available during my care of the patient were reviewed by me and considered in my medical decision making (see chart for details).       Patient clinically well in appearance.  She is nontoxic and alert.  Diagnostic work-up without remarkable findings.  Abdominal exam was for no reproducible tenderness.  Symptom most suggestive of viral gastroenteritis.  She was given fluids Pepcid and Zofran.  I did not have high suspicion for STD as etiology.  Patient had reported one sexual contact greater month ago with a known partner.  However we discussed discharge she wanted to know if she has had an HIV test.  Patient is not exhibiting any pelvic symptoms however due to patient concern, will  add HIV and STD testing to urinalysis.  She is clinically well in appearance and nontoxic.  Symptomatic treatment for the next 1 to 3 days.  Return precautions reviewed.  Patient advised for follow-up with PCP by the end of the week.  Final Clinical Impressions(s) / ED Diagnoses   Final diagnoses:  Non-intractable vomiting with nausea, unspecified vomiting type    ED Discharge Orders         Ordered    promethazine (PHENERGAN) 25 MG tablet  Every 6 hours PRN     09/25/18 1502    famotidine (PEPCID) 20 MG tablet  2 times daily     09/25/18 1502    Hyoscyamine Sulfate SL (LEVSIN/SL) 0.125 MG SUBL  3 times daily PRN     09/25/18 1502           Charlesetta Shanks, MD 09/25/18 1515

## 2018-09-25 NOTE — ED Notes (Signed)
An After Visit Summary was printed and given to the patient. Discharge instructions given and no further questions at this time.  

## 2018-09-25 NOTE — Discharge Instructions (Signed)
1.  Take Phenergan as needed for nausea for the next 24 hours.  Take small sips of fluids frequently. 2.  Take Pepcid twice daily to help with stomach irritation. 3.  Take Levsin 3 times a day if needed for stomach cramps. 4.  Follow-up with your family doctor within 1 to 3 days. 5.  Return to emergency department if you develop fever, pain not improving or other symptoms developing.

## 2018-09-25 NOTE — ED Triage Notes (Signed)
Pt BIB EMS from home. Pt c/o lower abdominal pain, nausea starting yesterday. Pt c/o weakness x 1 week. Pt reports vomiting at 1030. EMS reports pt ambulatory on scene. Pt denies SOB.   101.8 temp HR 100

## 2018-11-21 ENCOUNTER — Encounter: Payer: Self-pay | Admitting: Gynecology

## 2018-12-06 DIAGNOSIS — L853 Xerosis cutis: Secondary | ICD-10-CM | POA: Diagnosis not present

## 2019-01-08 ENCOUNTER — Other Ambulatory Visit: Payer: Self-pay | Admitting: *Deleted

## 2019-01-08 DIAGNOSIS — Z20822 Contact with and (suspected) exposure to covid-19: Secondary | ICD-10-CM

## 2019-01-10 LAB — NOVEL CORONAVIRUS, NAA: SARS-CoV-2, NAA: NOT DETECTED

## 2019-01-15 ENCOUNTER — Other Ambulatory Visit: Payer: Self-pay

## 2019-01-15 DIAGNOSIS — Z20822 Contact with and (suspected) exposure to covid-19: Secondary | ICD-10-CM

## 2019-01-16 LAB — NOVEL CORONAVIRUS, NAA: SARS-CoV-2, NAA: NOT DETECTED

## 2019-02-05 ENCOUNTER — Other Ambulatory Visit: Payer: Self-pay

## 2019-02-05 DIAGNOSIS — Z20822 Contact with and (suspected) exposure to covid-19: Secondary | ICD-10-CM

## 2019-02-07 LAB — NOVEL CORONAVIRUS, NAA: SARS-CoV-2, NAA: NOT DETECTED

## 2019-02-12 ENCOUNTER — Other Ambulatory Visit: Payer: Self-pay

## 2019-02-12 DIAGNOSIS — Z20822 Contact with and (suspected) exposure to covid-19: Secondary | ICD-10-CM

## 2019-02-13 LAB — NOVEL CORONAVIRUS, NAA: SARS-CoV-2, NAA: NOT DETECTED

## 2019-02-15 ENCOUNTER — Telehealth: Payer: Self-pay

## 2019-02-15 NOTE — Telephone Encounter (Signed)
Caller given negative result from 02/12/2019 and verbalized understanding  

## 2019-02-19 ENCOUNTER — Ambulatory Visit: Payer: 59 | Attending: Internal Medicine

## 2019-02-19 DIAGNOSIS — Z20828 Contact with and (suspected) exposure to other viral communicable diseases: Secondary | ICD-10-CM | POA: Diagnosis not present

## 2019-02-19 DIAGNOSIS — Z20822 Contact with and (suspected) exposure to covid-19: Secondary | ICD-10-CM

## 2019-02-20 LAB — NOVEL CORONAVIRUS, NAA: SARS-CoV-2, NAA: NOT DETECTED

## 2019-02-26 ENCOUNTER — Ambulatory Visit: Payer: 59 | Attending: Internal Medicine

## 2019-02-26 DIAGNOSIS — Z20822 Contact with and (suspected) exposure to covid-19: Secondary | ICD-10-CM

## 2019-02-26 DIAGNOSIS — Z20828 Contact with and (suspected) exposure to other viral communicable diseases: Secondary | ICD-10-CM | POA: Diagnosis not present

## 2019-02-28 LAB — NOVEL CORONAVIRUS, NAA: SARS-CoV-2, NAA: NOT DETECTED

## 2019-03-05 ENCOUNTER — Ambulatory Visit: Payer: 59 | Attending: Internal Medicine

## 2019-03-05 DIAGNOSIS — U071 COVID-19: Secondary | ICD-10-CM

## 2019-03-05 DIAGNOSIS — R238 Other skin changes: Secondary | ICD-10-CM | POA: Diagnosis not present

## 2019-03-06 LAB — NOVEL CORONAVIRUS, NAA: SARS-CoV-2, NAA: NOT DETECTED

## 2019-03-09 DIAGNOSIS — Z23 Encounter for immunization: Secondary | ICD-10-CM | POA: Diagnosis not present

## 2019-03-13 ENCOUNTER — Other Ambulatory Visit: Payer: 59

## 2019-03-17 ENCOUNTER — Encounter (HOSPITAL_COMMUNITY): Payer: Self-pay

## 2019-03-17 ENCOUNTER — Other Ambulatory Visit: Payer: Self-pay

## 2019-03-17 ENCOUNTER — Ambulatory Visit (HOSPITAL_COMMUNITY)
Admission: EM | Admit: 2019-03-17 | Discharge: 2019-03-17 | Disposition: A | Discharge status: Home / Self Care | Payer: 59 | Attending: Urgent Care | Admitting: Urgent Care

## 2019-03-17 DIAGNOSIS — L03114 Cellulitis of left upper limb: Secondary | ICD-10-CM | POA: Diagnosis not present

## 2019-03-17 DIAGNOSIS — M79622 Pain in left upper arm: Secondary | ICD-10-CM

## 2019-03-17 DIAGNOSIS — M7989 Other specified soft tissue disorders: Secondary | ICD-10-CM

## 2019-03-17 MED ORDER — PREDNISONE 20 MG PO TABS: ORAL_TABLET | ORAL | 0 refills | Status: DC

## 2019-03-17 MED ORDER — CEPHALEXIN 500 MG PO CAPS: 500.0000 mg | ORAL_CAPSULE | Freq: Three times a day (TID) | ORAL | 0 refills | Status: DC

## 2019-03-17 NOTE — ED Triage Notes (Signed)
No answer from patient when called for triage x2.

## 2019-03-17 NOTE — ED Provider Notes (Signed)
[facility]   MRN: [id number] DOB: 1970/08/24  Subjective:   Ashley [name] is a 49 y.o. female presenting for 1 day history of acute onset left upper arm pain, swelling. Has had persistent swelling in her arm since she received a COVID 19 vaccine ~1 week ago at [facility]. In the past day however, has gotten significantly worse with pain, redness.  Patient does think that the area that was injected was not the area that was swabbed with an alcohol pad as the person administering the test stopped and quickly performed another task prior to injecting her arm.   No current facility-administered medications for this encounter.  Current Outpatient Medications:  .  acetaminophen (TYLENOL) 325 MG tablet, Take 650 mg by mouth every 6 (six) hours as needed for mild pain or headache., Disp: , Rfl:  .  benzonatate (TESSALON) 100 MG capsule, Take 1 capsule (100 mg total) by mouth 2 (two) times daily as needed for cough. (Patient not taking: Reported on 09/25/2018), Disp: 20 capsule, Rfl: 0 .  CRANBERRY PO, Take 1 tablet by mouth daily., Disp: , Rfl:  .  famotidine (PEPCID) 20 MG tablet, Take 1 tablet (20 mg total) by mouth 2 (two) times daily., Disp: 30 tablet, Rfl: 0 .  GARLIC PO, Take 1 tablet by mouth every other day., Disp: , Rfl:  .  Hyoscyamine Sulfate SL (LEVSIN/SL) 0.125 MG SUBL, Place 0.125 tablets under the tongue 3 (three) times daily as needed., Disp: 15 tablet, Rfl: 0 .  Magnesium 200 MG TABS, Take 200 mg by mouth at bedtime., Disp: , Rfl:  .  Multiple Vitamin (MULTIVITAMIN WITH MINERALS) TABS tablet, Take 1 tablet by mouth daily., Disp: , Rfl:  .  ondansetron (ZOFRAN) 8 MG tablet, Take 1 tablet (8 mg total) by mouth every 8 (eight) hours as needed for nausea or vomiting., Disp: 20 tablet, Rfl: 0 .  promethazine (PHENERGAN) 25 MG tablet, Take 1 tablet (25 mg total) by mouth every 6 (six) hours as needed for nausea or vomiting., Disp: 20 tablet, Rfl: 0 .  TURMERIC PO, Take 1 capsule by  mouth every other day. , Disp: , Rfl:  .  zolmitriptan (ZOMIG-ZMT) 5 MG disintegrating tablet, Take 1 tablet (5 mg total) by mouth as needed for migraine., Disp: 10 tablet, Rfl: 0   Allergies  Allergen Reactions  . Codeine Nausea Only    Past Medical History:  Diagnosis Date  . Anxiety   . ASCUS favor benign 04/2013   Negative high risk HPV screen. Recommend repeat Pap smear/HPV one year  . 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 Endometrial polyp 04/2013   Removed in the office extruding from the cervical os  . Hormone disorder   . Migraines   . Urinary incontinence      Past Surgical History:  Procedure Laterality Date  . DILATATION & CURETTAGE/HYSTEROSCOPY WITH TRUECLEAR N/A 09/18/2013   Procedure: DILATATION & CURETTAGE/HYSTEROSCOPY WITH TRUCLEAR;  Surgeon: Dara Lords, MD;  Location: [facility];  Service: Gynecology;  Laterality: N/A;  . WISDOM TOOTH EXTRACTION Bilateral     Family History  Problem Relation Age of Onset  . Bipolar disorder Mother   . Mental illness Mother   . Parkinsonism Father   . Hypertension Father   . Cancer Paternal Grandmother        Breast    Social History   Tobacco Use  . Smoking status: Never Smoker  . Smokeless tobacco: Never Used  Substance  Use Topics  . Alcohol use: Yes    Comment: occ  . Drug use: No    ROS   Objective:   Vitals: BP 118/67 (BP Location: Right Arm)   Pulse 74   Temp 98 F (36.7 C) (Oral)   Resp 16   Wt 130 lb (59 kg)   LMP 08/01/2014 (Exact Date)   SpO2 100%   BMI 20.36 kg/m   Physical Exam Constitutional:      General: She is not in acute distress.    Appearance: Normal appearance. She is well-developed. She is not ill-appearing, toxic-appearing or diaphoretic.  HENT:     Head: Normocephalic and atraumatic.     Nose: Nose normal.     Mouth/Throat:     Mouth: Mucous membranes are moist.     Pharynx: Oropharynx is clear.  Eyes:     General: No scleral icterus.       Right  eye: No discharge.        Left eye: No discharge.     Extraocular Movements: Extraocular movements intact.     Pupils: Pupils are equal, round, and reactive to light.  Cardiovascular:     Rate and Rhythm: Normal rate.  Pulmonary:     Effort: Pulmonary effort is normal.  Musculoskeletal:     Left upper arm: Swelling (with associated warmth) and tenderness (diffuse over area of erythema with associated induration) present. No deformity, lacerations or bony tenderness.  Skin:    General: Skin is warm and dry.  Neurological:     General: No focal deficit present.     Mental Status: She is alert and oriented to person, place, and time.  Psychiatric:        Mood and Affect: Mood normal.        Behavior: Behavior normal.        Thought Content: Thought content normal.        Judgment: Judgment normal.            Assessment and Plan :   1. Cellulitis of left upper extremity   2. Left upper arm pain   3. Left arm swelling     Will cover patient for cellulitis with Keflex.  Will also use prednisone given the diffuse inflammation that she has an possibility of a localized reaction to medication. Counseled patient on potential for adverse effects with medications prescribed/recommended today, ER and return-to-clinic precautions discussed, patient verbalized understanding.    Jaynee Eagles, PA-C 03/17/19 1715

## 2019-03-17 NOTE — ED Triage Notes (Signed)
Pt states she got a Covid vaccine a week ago and now her left arm is red, hard and swollen.

## 2019-03-19 ENCOUNTER — Ambulatory Visit: Payer: 59 | Attending: Internal Medicine

## 2019-03-19 DIAGNOSIS — Z20822 Contact with and (suspected) exposure to covid-19: Secondary | ICD-10-CM | POA: Diagnosis not present

## 2019-03-20 LAB — NOVEL CORONAVIRUS, NAA: SARS-CoV-2, NAA: NOT DETECTED

## 2019-03-26 ENCOUNTER — Other Ambulatory Visit: Payer: Self-pay

## 2019-03-27 ENCOUNTER — Encounter: Payer: Self-pay | Admitting: Family Medicine

## 2019-03-27 ENCOUNTER — Telehealth: Payer: Self-pay | Admitting: Family Medicine

## 2019-03-27 ENCOUNTER — Ambulatory Visit (INDEPENDENT_AMBULATORY_CARE_PROVIDER_SITE_OTHER): Payer: 59 | Admitting: Family Medicine

## 2019-03-27 VITALS — BP 110/64 | HR 67 | Temp 97.8°F | Ht 67.0 in | Wt 132.8 lb

## 2019-03-27 DIAGNOSIS — B373 Candidiasis of vulva and vagina: Secondary | ICD-10-CM

## 2019-03-27 DIAGNOSIS — T7841XD Arthus phenomenon, subsequent encounter: Secondary | ICD-10-CM | POA: Diagnosis not present

## 2019-03-27 DIAGNOSIS — T7841XA Arthus phenomenon, initial encounter: Secondary | ICD-10-CM | POA: Insufficient documentation

## 2019-03-27 DIAGNOSIS — Z0000 Encounter for general adult medical examination without abnormal findings: Secondary | ICD-10-CM

## 2019-03-27 DIAGNOSIS — F419 Anxiety disorder, unspecified: Secondary | ICD-10-CM

## 2019-03-27 DIAGNOSIS — B3731 Acute candidiasis of vulva and vagina: Secondary | ICD-10-CM | POA: Insufficient documentation

## 2019-03-27 LAB — CBC
HCT: 42.9 % (ref 36.0–46.0)
Hemoglobin: 14.3 g/dL (ref 12.0–15.0)
MCHC: 33.2 g/dL (ref 30.0–36.0)
MCV: 94.5 fl (ref 78.0–100.0)
Platelets: 236 10*3/uL (ref 150.0–400.0)
RBC: 4.54 Mil/uL (ref 3.87–5.11)
RDW: 12.2 % (ref 11.5–15.5)
WBC: 6.5 10*3/uL (ref 4.0–10.5)

## 2019-03-27 LAB — COMPREHENSIVE METABOLIC PANEL
ALT: 31 U/L (ref 0–35)
AST: 29 U/L (ref 0–37)
Albumin: 4.8 g/dL (ref 3.5–5.2)
Alkaline Phosphatase: 103 U/L (ref 39–117)
BUN: 11 mg/dL (ref 6–23)
CO2: 28 mEq/L (ref 19–32)
Calcium: 9.4 mg/dL (ref 8.4–10.5)
Chloride: 99 mEq/L (ref 96–112)
Creatinine, Ser: 0.69 mg/dL (ref 0.40–1.20)
GFR: 90.49 mL/min (ref >60.00)
Glucose, Bld: 80 mg/dL (ref 70–99)
Potassium: 3.7 mEq/L (ref 3.5–5.1)
Sodium: 133 mEq/L — ABNORMAL LOW (ref 135–145)
Total Bilirubin: 0.3 mg/dL (ref 0.2–1.2)
Total Protein: 7.5 g/dL (ref 6.0–8.3)

## 2019-03-27 LAB — URINALYSIS, ROUTINE W REFLEX MICROSCOPIC
Bilirubin Urine: NEGATIVE
Hgb urine dipstick: NEGATIVE
Ketones, ur: NEGATIVE
Leukocytes,Ua: NEGATIVE
Nitrite: NEGATIVE
RBC / HPF: NONE SEEN (ref 0–?)
Specific Gravity, Urine: <=1.005 — AB (ref 1.000–1.030)
Total Protein, Urine: NEGATIVE
Urine Glucose: NEGATIVE
Urobilinogen, UA: 0.2 (ref 0.0–1.0)
WBC, UA: NONE SEEN (ref 0–?)
pH: 6.5 (ref 5.0–8.0)

## 2019-03-27 LAB — LDL CHOLESTEROL, DIRECT: Direct LDL: 97 mg/dL

## 2019-03-27 MED ORDER — FLUCONAZOLE 150 MG PO TABS: 150.0000 mg | ORAL_TABLET | Freq: Once | ORAL | 0 refills | Status: AC

## 2019-03-27 NOTE — Telephone Encounter (Signed)
Pt asked if you would please let her know when or if you do send it in

## 2019-03-27 NOTE — Patient Instructions (Signed)

## 2019-03-27 NOTE — Progress Notes (Addendum)
Established Patient Office Visit  Subjective:  Patient ID: Ashley [name], female    DOB: [birth year]  Age: 49 y.o. MRN: [id number]  CC:  Chief Complaint  Patient presents with  . Annual Exam    No concerns.    HPI Ashley [name] presents for her yearly physical.  She has been doing relatively well.  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  She lives alone.  She is seeing the dentist twice yearly and has an eye check next week.  Seeing GYN for female care.  She is exercising she tells me.  She feels anxious often.  She is not interested in treatment at this time.  Her twin sister is currently being treated with a medication.  She does not smoke or use illicit drugs.  She drinks alcohol on occasion with her friends.  She is an avid reader.  She received the Covid vaccine a few weeks ago and developed swelling erythema and tenderness at the injection site.  She was seen at urgent care for this and treated with cephalexin and prednisone.  Resolved promptly.  She did not tolerate the prednisone well because it had affected her sleep. She has developed some vaginal pruritis after taking the keflex.   Past Medical History:  Diagnosis Date  . Anxiety   . ASCUS favor benign 04/2013   Negative high risk HPV screen. Recommend repeat Pap smear/HPV one year  . 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 Endometrial polyp 04/2013   Removed in the office extruding from the cervical os  . Hormone disorder   . Migraines   . Urinary incontinence     Past Surgical History:  Procedure Laterality Date  . DILATATION & CURETTAGE/HYSTEROSCOPY WITH TRUECLEAR N/A 09/18/2013   Procedure: DILATATION & CURETTAGE/HYSTEROSCOPY WITH TRUCLEAR;  Surgeon: Dara Lords, MD;  Location: [facility];  Service: Gynecology;  Laterality: N/A;  . WISDOM TOOTH EXTRACTION Bilateral     Family History  Problem Relation Age of Onset  . Bipolar disorder Mother   .  Mental illness Mother   . Parkinsonism Father   . Hypertension Father   . Cancer Paternal Grandmother        Breast    Social History   Socioeconomic History  . Marital status: Single    Spouse name: Not on file  . Number of children: Not on file  . Years of education: Not on file  . Highest education level: Not on file  Occupational History  . Not on file  Tobacco Use  . Smoking status: Never Smoker  . Smokeless tobacco: Never Used  Substance and Sexual Activity  . Alcohol use: Yes    Comment: occ  . Drug use: No  . Sexual activity: Not Currently  Other Topics Concern  . Not on file  Social History Narrative  . Not on file   Social Determinants of Health   Financial Resource Strain:   . Difficulty of Paying Living Expenses: Not on file  Food Insecurity:   . Worried About Programme researcher, broadcasting/film/video in the Last Year: Not on file  . Ran Out of Food in the Last Year: Not on file  Transportation Needs:   . Lack of Transportation (Medical): Not on file  . Lack of Transportation (Non-Medical): Not on file  Physical Activity:   . Days of Exercise per Week: Not on file  . Minutes of Exercise per Session: Not on file  Stress:   . Feeling of Stress : Not on file  Social Connections:   . Frequency of Communication with Friends and Family: Not on file  . Frequency of Social Gatherings with Friends and Family: Not on file  . Attends Religious Services: Not on file  . Active Member of Clubs or Organizations: Not on file  . Attends Banker Meetings: Not on file  . Marital Status: Not on file  Intimate Partner Violence:   . Fear of Current or Ex-Partner: Not on file  . Emotionally Abused: Not on file  . Physically Abused: Not on file  . Sexually Abused: Not on file    Outpatient Medications Prior to Visit  Medication Sig Dispense Refill  . acetaminophen (TYLENOL) 325 MG tablet Take 650 mg by mouth every 6 (six) hours as needed for mild pain or headache.    .  cephALEXin (KEFLEX) 500 MG capsule Take 1 capsule (500 mg total) by mouth 3 (three) times daily. 30 capsule 0  . CRANBERRY PO Take 1 tablet by mouth daily.    . Magnesium 200 MG TABS Take 200 mg by mouth at bedtime.    . Multiple Vitamin (MULTIVITAMIN WITH MINERALS) TABS tablet Take 1 tablet by mouth daily.    . benzonatate (TESSALON) 100 MG capsule Take 1 capsule (100 mg total) by mouth 2 (two) times daily as needed for cough. (Patient not taking: Reported on 09/25/2018) 20 capsule 0  . famotidine (PEPCID) 20 MG tablet Take 1 tablet (20 mg total) by mouth 2 (two) times daily. (Patient not taking: Reported on 03/27/2019) 30 tablet 0  . GARLIC PO Take 1 tablet by mouth every other day.    Marland Kitchen Hyoscyamine Sulfate SL (LEVSIN/SL) 0.125 MG SUBL Place 0.125 tablets under the tongue 3 (three) times daily as needed. (Patient not taking: Reported on 03/27/2019) 15 tablet 0  . ondansetron (ZOFRAN) 8 MG tablet Take 1 tablet (8 mg total) by mouth every 8 (eight) hours as needed for nausea or vomiting. (Patient not taking: Reported on 03/27/2019) 20 tablet 0  . predniSONE (DELTASONE) 20 MG tablet Take 2 tablets daily with breakfast. (Patient not taking: Reported on 03/27/2019) 10 tablet 0  . promethazine (PHENERGAN) 25 MG tablet Take 1 tablet (25 mg total) by mouth every 6 (six) hours as needed for nausea or vomiting. (Patient not taking: Reported on 03/27/2019) 20 tablet 0  . TURMERIC PO Take 1 capsule by mouth every other day.     . zolmitriptan (ZOMIG-ZMT) 5 MG disintegrating tablet Take 1 tablet (5 mg total) by mouth as needed for migraine. (Patient not taking: Reported on 03/27/2019) 10 tablet 0   No facility-administered medications prior to visit.    Allergies  Allergen Reactions  . Codeine Nausea Only    ROS Review of Systems  Constitutional: Negative.   HENT: Negative.   Eyes: Negative for photophobia and visual disturbance.  Respiratory: Negative.   Cardiovascular: Negative.   Gastrointestinal:  Negative.   Genitourinary: Positive for frequency. Negative for dysuria, hematuria and vaginal discharge.  Musculoskeletal: Negative.   Skin: Negative for pallor and rash.  Neurological: Positive for tremors and speech difficulty.  Psychiatric/Behavioral: Negative for dysphoric mood. The patient is nervous/anxious.       Objective:    Physical Exam  Constitutional: She is oriented to person, place, and time. She appears well-developed and well-nourished. No distress.  HENT:  Head: Normocephalic and atraumatic.  Right Ear: External ear normal.  Left Ear: External ear normal.  Mouth/Throat: Oropharynx is clear and moist. No oropharyngeal exudate.  Eyes: Conjunctivae are normal. Right eye exhibits no discharge. Left eye exhibits no discharge. No scleral icterus.  Neck: No JVD present. No tracheal deviation present.  Cardiovascular: Normal rate, regular rhythm and normal heart sounds.  Pulmonary/Chest: Effort normal and breath sounds normal. No stridor.  Neurological: She is alert and oriented to person, place, and time.  Skin: Skin is warm and dry. She is not diaphoretic.     Psychiatric: She has a normal mood and affect. Her behavior is normal.    BP 110/64   Pulse 67   Temp 97.8 F (36.6 C) (Tympanic)   Ht 5\' 7"  (1.702 m)   Wt 132 lb 12.8 oz (60.2 kg)   LMP 08/01/2014 (Exact Date)   SpO2 100%   BMI 20.80 kg/m  Wt Readings from Last 3 Encounters:  03/27/19 132 lb 12.8 oz (60.2 kg)  03/17/19 130 lb (59 kg)  02/04/18 128 lb (58.1 kg)     There are no preventive care reminders to display for this patient.  There are no preventive care reminders to display for this patient.  Lab Results  Component Value Date   TSH 0.907 12/15/2017   Lab Results  Component Value Date   WBC 3.4 (L) 09/25/2018   HGB 13.4 09/25/2018   HCT 39.9 09/25/2018   MCV 94.8 09/25/2018   PLT 105 (L) 09/25/2018   Lab Results  Component Value Date   NA 134 (L) 09/25/2018   K 3.4 (L)  09/25/2018   CO2 25 09/25/2018   GLUCOSE 105 (H) 09/25/2018   BUN 14 09/25/2018   CREATININE 1.00 09/25/2018   BILITOT 1.6 (H) 09/25/2018   ALKPHOS 101 09/25/2018   AST 28 09/25/2018   ALT 36 09/25/2018   PROT 7.3 09/25/2018   ALBUMIN 4.3 09/25/2018   CALCIUM 9.0 09/25/2018   ANIONGAP 11 09/25/2018   GFR 96.92 06/08/2017   Lab Results  Component Value Date   CHOL 137 06/08/2017   Lab Results  Component Value Date   HDL 52.10 06/08/2017   Lab Results  Component Value Date   LDLCALC 70 06/08/2017   Lab Results  Component Value Date   TRIG 73.0 06/08/2017   Lab Results  Component Value Date   CHOLHDL 3 06/08/2017   No results found for: HGBA1C    Assessment & Plan:   Problem List Items Addressed This Visit      Musculoskeletal and Integument   Arthus phenomenon     Genitourinary   Yeast vaginitis   Relevant Medications   fluconazole (DIFLUCAN) 150 MG tablet     Other   Anxiety   Healthcare maintenance - Primary   Relevant Orders   CBC   Comprehensive metabolic panel   LDL cholesterol, direct   Urinalysis, Routine w reflex microscopic      Meds ordered this encounter  Medications  . fluconazole (DIFLUCAN) 150 MG tablet    Sig: Take 1 tablet (150 mg total) by mouth once for 1 dose.    Dispense:  1 tablet    Refill:  0    Follow-up: Return in about 1 year (around 03/26/2020).   Patient was given information on health maintenance disease prevention and anxiety.  She will return if she would like to discuss medical treatment.   Libby Maw, MD

## 2019-03-27 NOTE — Telephone Encounter (Signed)
Pt was calling because she forgot to ask for prescription for something that they normally prescribe to treat an yeast infection after taking an antibiotic

## 2019-03-27 NOTE — Telephone Encounter (Signed)
Done

## 2019-03-27 NOTE — Addendum Note (Signed)
Addended by: Andrez Grime on: 03/27/2019 03:12 PM   Modules accepted: Orders

## 2019-03-27 NOTE — Telephone Encounter (Signed)
Pt aware RX sent to pharmacy.

## 2019-04-06 DIAGNOSIS — Z23 Encounter for immunization: Secondary | ICD-10-CM | POA: Diagnosis not present

## 2019-04-10 DIAGNOSIS — H52203 Unspecified astigmatism, bilateral: Secondary | ICD-10-CM | POA: Diagnosis not present

## 2019-04-10 DIAGNOSIS — H5213 Myopia, bilateral: Secondary | ICD-10-CM | POA: Diagnosis not present

## 2019-04-16 ENCOUNTER — Other Ambulatory Visit: Payer: Self-pay

## 2019-04-16 ENCOUNTER — Ambulatory Visit: Payer: BLUE CROSS/BLUE SHIELD | Admitting: Obstetrics and Gynecology

## 2019-04-16 NOTE — Progress Notes (Signed)
49 y.o. G0P0000 Single White or Caucasian Not Hispanic or Latino female here for annual exam. PMP no bleeding. No longer having hot flashes or night sweats.   Patient states that she was on antibiotic for cellulitis last month after getting her covid vaccine. She is is concerned about a yeast infection because she has seen some whitish discharge.  The discharge started 2 months ago, the d/c is in the labial folds. No itching, burning or irritation.     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   Patient's last menstrual period was 08/01/2014 (exact date).          Sexually active: No.  The current method of family planning is abstinence.    Exercising: Yes.    Walking  Smoker:  no  Health Maintenance: Pap: 12/15/17 neg HPV Neg   [facility] History of abnormal Pap:  Yes in 2017 LGSIL, went to the [facility] for a colposcopy, reports normal results MMG:  08/29/15 Density D Bi-rads 1 neg.  TDaP:  12/15/17  Gardasil: no    reports that she has never smoked. She has never used smokeless tobacco. She reports current alcohol use. She reports that she does not use drugs. Rare ETOH. She works at in [employer] at [employer] for [employer].    Past Medical History:  Diagnosis Date  . Anxiety   . ASCUS favor benign 04/2013   Negative high risk HPV screen. Recommend repeat Pap smear/HPV one year  . 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 Endometrial polyp 04/2013   Removed in the office extruding from the cervical os  . Hormone disorder   . Migraines   . Urinary incontinence     Past Surgical History:  Procedure Laterality Date  . DILATATION & CURETTAGE/HYSTEROSCOPY WITH TRUECLEAR N/A 09/18/2013   Procedure: DILATATION & CURETTAGE/HYSTEROSCOPY WITH TRUCLEAR;  Surgeon: Anastasio Auerbach, MD;  Location: [facility];  Service: Gynecology;  Laterality: N/A;  . WISDOM TOOTH EXTRACTION Bilateral     Current  Outpatient Medications  Medication Sig Dispense Refill  . acetaminophen (TYLENOL) 325 MG tablet Take 650 mg by mouth every 6 (six) hours as needed for mild pain or headache.    . benzonatate (TESSALON) 100 MG capsule Take 1 capsule (100 mg total) by mouth 2 (two) times daily as needed for cough. (Patient not taking: Reported on 09/25/2018) 20 capsule 0  . cephALEXin (KEFLEX) 500 MG capsule Take 1 capsule (500 mg total) by mouth 3 (three) times daily. 30 capsule 0  . CRANBERRY PO Take 1 tablet by mouth daily.    . famotidine (PEPCID) 20 MG tablet Take 1 tablet (20 mg total) by mouth 2 (two) times daily. (Patient not taking: Reported on 03/27/2019) 30 tablet 0  . GARLIC PO Take 1 tablet by mouth every other day.    Marland Kitchen Hyoscyamine Sulfate SL (LEVSIN/SL) 0.125 MG SUBL Place 0.125 tablets under the tongue 3 (three) times daily as needed. (Patient not taking: Reported on 03/27/2019) 15 tablet 0  . Magnesium 200 MG TABS Take 200 mg by mouth at bedtime.    . Multiple Vitamin (MULTIVITAMIN WITH MINERALS) TABS tablet Take 1 tablet by mouth daily.    . ondansetron (ZOFRAN) 8 MG tablet Take 1 tablet (8 mg total) by mouth every 8 (eight) hours as needed for nausea or vomiting. (Patient not taking: Reported on 03/27/2019) 20 tablet 0  . predniSONE (DELTASONE)  20 MG tablet Take 2 tablets daily with breakfast. (Patient not taking: Reported on 03/27/2019) 10 tablet 0  . promethazine (PHENERGAN) 25 MG tablet Take 1 tablet (25 mg total) by mouth every 6 (six) hours as needed for nausea or vomiting. (Patient not taking: Reported on 03/27/2019) 20 tablet 0  . TURMERIC PO Take 1 capsule by mouth every other day.     . zolmitriptan (ZOMIG-ZMT) 5 MG disintegrating tablet Take 1 tablet (5 mg total) by mouth as needed for migraine. (Patient not taking: Reported on 03/27/2019) 10 tablet 0   No current facility-administered medications for this visit.    Family History  Problem Relation Age of Onset  . Bipolar disorder Mother    . Mental illness Mother   . Parkinsonism Father   . Hypertension Father   . Cancer Paternal Grandmother        Breast    Review of Systems  Genitourinary: Positive for vaginal discharge.  All other systems reviewed and are negative.   Exam:   LMP 08/01/2014 (Exact Date)   Weight change: @WEIGHTCHANGE @ Height:      Ht Readings from Last 3 Encounters:  03/27/19 5\' 7"  (1.702 m)  02/04/18 5\' 7"  (1.702 m)  12/15/17 5\' 7"  (1.702 m)    General appearance: alert, cooperative and appears stated age Head: Normocephalic, without obvious abnormality, atraumatic Neck: no adenopathy, supple, symmetrical, trachea midline and thyroid normal to inspection and palpation Lungs: clear to auscultation bilaterally Cardiovascular: regular rate and rhythm Breasts: normal appearance, no masses or tenderness Abdomen: soft, non-tender; non distended,  no masses,  no organomegaly Extremities: extremities normal, atraumatic, no cyanosis or edema Skin: Skin color, texture, turgor normal. No rashes or lesions Lymph nodes: Cervical, supraclavicular, and axillary nodes normal. No abnormal inguinal nodes palpated Neurologic: Grossly normal   Pelvic: External genitalia:  no lesions              Urethra:  normal appearing urethra with no masses, tenderness or lesions              Bartholins and Skenes: normal                 Vagina: atrophic appearing vagina with normal color, no discharge noted, no lesions              Cervix: no lesions               Bimanual Exam:  Uterus:  normal size, contour, position, consistency, mobility, non-tender and anteverted              Adnexa: no mass, fullness, tenderness               Rectovaginal: Confirms               Anus:  normal sphincter tone, no lesions  14/07/19 chaperoned for the exam.  A:  Well Woman with normal exam  PMP, no c/o  Vaginal discharge. Not noted on exam, no other symptoms   P:   Per ASCCP guidelines pap due in 2022  She declines  mammogram, aware of risks of missing an early cancer. Discussed the risks of mammogram, including false + results and minimal radiation exposure.  Plans on getting Thermography. Aware it's not FDA approved.    Given the # to schedule mammogram in case she changes her mind.   Discussed breast self exam  Discussed calcium and vit D intake  Labs with primary  Nuswab sent for BV/Yeast

## 2019-04-17 ENCOUNTER — Encounter: Payer: Self-pay | Admitting: Obstetrics and Gynecology

## 2019-04-17 ENCOUNTER — Ambulatory Visit (INDEPENDENT_AMBULATORY_CARE_PROVIDER_SITE_OTHER): Payer: 59 | Admitting: Obstetrics and Gynecology

## 2019-04-17 VITALS — BP 130/64 | HR 93 | Temp 97.5°F | Ht 66.25 in | Wt 134.0 lb

## 2019-04-17 DIAGNOSIS — Z780 Asymptomatic menopausal state: Secondary | ICD-10-CM

## 2019-04-17 DIAGNOSIS — Z01419 Encounter for gynecological examination (general) (routine) without abnormal findings: Secondary | ICD-10-CM

## 2019-04-17 DIAGNOSIS — N898 Other specified noninflammatory disorders of vagina: Secondary | ICD-10-CM | POA: Diagnosis not present

## 2019-04-17 NOTE — Patient Instructions (Addendum)
Mammogram Facilities  Yearly screening mammograms are recommended for women beginning at age 49. For a routine screening mammogram, you may schedule the appointment and have it done at the location of your choice.  Please ask the facility to send the results to our office. (fax [phone]) Location options include:  [facility], [place], [place] [address] [phone]  [facility] [address], [place], [place] [address] [phone]   EXERCISE AND DIET:  We recommended that you start or continue a regular exercise program for good health. Regular exercise means any activity that makes your heart beat faster and makes you sweat.  We recommend exercising at least 30 minutes per day at least 3 days a week, preferably 4 or 5.  We also recommend a diet low in fat and sugar.  Inactivity, poor dietary choices and obesity can cause diabetes, heart attack, stroke, and kidney damage, among others.    ALCOHOL AND SMOKING:  Women should limit their alcohol intake to no more than 7 drinks/beers/glasses of wine (combined, not each!) per week. Moderation of alcohol intake to this level decreases your risk of breast cancer and liver damage. And of course, no recreational drugs are part of a healthy lifestyle.  And absolutely no smoking or even second hand smoke. Most people know smoking can cause heart and lung diseases, but did you know it also contributes to weakening of your bones? Aging of your skin?  Yellowing of your teeth and nails?  CALCIUM AND VITAMIN D:  Adequate intake of calcium and Vitamin D are recommended.  The recommendations for exact amounts of these supplements seem to change often, but generally speaking 1,000 mg of calcium (between diet and supplement) and 800 units of Vitamin D per day seems prudent. Certain women may benefit from higher intake of Vitamin D.  If you are among these women, your doctor will have  told you during your visit.    PAP SMEARS:  Pap smears, to check for cervical cancer or precancers,  have traditionally been done yearly, although recent scientific advances have shown that most women can have pap smears less often.  However, every woman still should have a physical exam from her gynecologist every year. It will include a breast check, inspection of the vulva and vagina to check for abnormal growths or skin changes, a visual exam of the cervix, and then an exam to evaluate the size and shape of the uterus and ovaries.  And after 49 years of age, a rectal exam is indicated to check for rectal cancers. We will also provide age appropriate advice regarding health maintenance, like when you should have certain vaccines, screening for sexually transmitted diseases, bone density testing, colonoscopy, mammograms, etc.   MAMMOGRAMS:  All women over 10 years old should have a yearly mammogram. Many facilities now offer a "3D" mammogram, which may cost around $50 extra out of pocket. If possible,  we recommend you accept the option to have the 3D mammogram performed.  It both reduces the number of women who will be called back for extra views which then turn out to be normal, and it is better than the routine mammogram at detecting truly abnormal areas.    COLON CANCER SCREENING: Now recommend starting at age 103. At this time colonoscopy is not covered for routine screening until 50. There are take home tests that can be done between 45-49.   COLONOSCOPY:  Colonoscopy to screen  for colon cancer is recommended for all women at age 83.  We know, you hate the idea of the prep.  We agree, BUT, having colon cancer and not knowing it is worse!!  Colon cancer so often starts as a polyp that can be seen and removed at colonscopy, which can quite literally save your life!  And if your first colonoscopy is normal and you have no family history of colon cancer, most women don't have to have it again for 10  years.  Once every ten years, you can do something that may end up saving your life, right?  We will be happy to help you get it scheduled when you are ready.  Be sure to check your insurance coverage so you understand how much it will cost.  It may be covered as a preventative service at no cost, but you should check your particular policy.      Breast Self-Awareness Breast self-awareness means being familiar with how your breasts look and feel. It involves checking your breasts regularly and reporting any changes to your health care provider. Practicing breast self-awareness is important. A change in your breasts can be a sign of a serious medical problem. Being familiar with how your breasts look and feel allows you to find any problems early, when treatment is more likely to be successful. All women should practice breast self-awareness, including women who have had breast implants. How to do a breast self-exam One way to learn what is normal for your breasts and whether your breasts are changing is to do a breast self-exam. To do a breast self-exam: Look for Changes  1. Remove all the clothing above your waist. 2. Stand in front of a mirror in a room with good lighting. 3. Put your hands on your hips. 4. Push your hands firmly downward. 5. Compare your breasts in the mirror. Look for differences between them (asymmetry), such as: ? Differences in shape. ? Differences in size. ? Puckers, dips, and bumps in one breast and not the other. 6. Look at each breast for changes in your skin, such as: ? Redness. ? Scaly areas. 7. Look for changes in your nipples, such as: ? Discharge. ? Bleeding. ? Dimpling. ? Redness. ? A change in position. Feel for Changes Carefully feel your breasts for lumps and changes. It is best to do this while lying on your back on the floor and again while sitting or standing in the shower or tub with soapy water on your skin. Feel each breast in the following  way:  Place the arm on the side of the breast you are examining above your head.  Feel your breast with the other hand.  Start in the nipple area and make  inch (2 cm) overlapping circles to feel your breast. Use the pads of your three middle fingers to do this. Apply light pressure, then medium pressure, then firm pressure. The light pressure will allow you to feel the tissue closest to the skin. The medium pressure will allow you to feel the tissue that is a little deeper. The firm pressure will allow you to feel the tissue close to the ribs.  Continue the overlapping circles, moving downward over the breast until you feel your ribs below your breast.  Move one finger-width toward the center of the body. Continue to use the  inch (2 cm) overlapping circles to feel your breast as you move slowly up toward your collarbone.  Continue the up and down  exam using all three pressures until you reach your armpit.  Write Down What You Find  Write down what is normal for each breast and any changes that you find. Keep a written record with breast changes or normal findings for each breast. By writing this information down, you do not need to depend only on memory for size, tenderness, or location. Write down where you are in your menstrual cycle, if you are still menstruating. If you are having trouble noticing differences in your breasts, do not get discouraged. With time you will become more familiar with the variations in your breasts and more comfortable with the exam. How often should I examine my breasts? Examine your breasts every month. If you are breastfeeding, the best time to examine your breasts is after a feeding or after using a breast pump. If you menstruate, the best time to examine your breasts is 5-7 days after your period is over. During your period, your breasts are lumpier, and it may be more difficult to notice changes. When should I see my health care provider? See your health  care provider if you notice:  A change in shape or size of your breasts or nipples.  A change in the skin of your breast or nipples, such as a reddened or scaly area.  Unusual discharge from your nipples.  A lump or thick area that was not there before.  Pain in your breasts.  Anything that concerns you.

## 2019-04-20 LAB — NUSWAB BV AND CANDIDA, NAA
Candida albicans, NAA: NEGATIVE
Candida glabrata, NAA: NEGATIVE

## 2019-09-05 ENCOUNTER — Other Ambulatory Visit: Payer: Self-pay | Admitting: Family Medicine

## 2019-09-05 DIAGNOSIS — Z1231 Encounter for screening mammogram for malignant neoplasm of breast: Secondary | ICD-10-CM

## 2019-09-11 ENCOUNTER — Telehealth: Payer: Self-pay | Admitting: Obstetrics and Gynecology

## 2019-09-11 ENCOUNTER — Other Ambulatory Visit: Payer: Self-pay | Admitting: Obstetrics and Gynecology

## 2019-09-11 ENCOUNTER — Telehealth: Payer: Self-pay | Admitting: Family Medicine

## 2019-09-11 NOTE — Telephone Encounter (Signed)
Returned patients call, no answer LMTCB 

## 2019-09-11 NOTE — Telephone Encounter (Signed)
Patient is returning the call and wanted to let Dr. Doreene Burke know that the referral is no longer needed. Patient stated that her OBGYN put in the referral.

## 2019-09-11 NOTE — Telephone Encounter (Signed)
Patient needs referral for diagnostic mammogram, states she found a lump in her breast. Please send to [facility]. Patient would like a call back from the nurse also.

## 2019-09-11 NOTE — Telephone Encounter (Signed)
Patient returned call

## 2019-09-11 NOTE — Telephone Encounter (Signed)
Patient is calling back to check the status of her referral for a diagnostic mammogram. Explained to patient that provider and nurse have been with patients and will give her a call back as soon as they have a moment.

## 2019-09-11 NOTE — Telephone Encounter (Signed)
Patient has a left breast lump.

## 2019-09-11 NOTE — Telephone Encounter (Signed)
Left message for pt to return call to triage RN. 

## 2019-09-11 NOTE — Telephone Encounter (Signed)
Patient is calling back to check the status of the previous message left, please advise. CB is [phone]

## 2019-09-11 NOTE — Telephone Encounter (Signed)
[facility] 04/2019 Last MMG 2017-birads 1, neg Left breast dx/us 05/2013- birads 2, benign.  PMP, no HRT Declined MMG at [facility] in 2021  Spoke with pt. Pt states finding left breast lump over weekend near arm pit. Pt states area is painful/tender. Denies redness, nipple discharge, breast skin or size changes. Pt states had Moderna Covid vaccine in Jan and Feb 2021.  Pt states does not have breast implants.  Denies fever,chills or any warmth to area.   Pt states called [facility] for dx MMG order and was told needed referral. Pt advised OV for exam and then will have dx MMG order placed. Pt states also placed call to PCP for order. Pt agreeable to schedule OV with Dr Oscar La. Reviewed call with Dr Oscar La. Pt ok to be scheduled on 7/15 at 945 am. Pt agreeable and verbalized understanding of date and time of appt. CPS neg   Routing to Dr Oscar La for review.  Encounter closed.

## 2019-09-12 ENCOUNTER — Ambulatory Visit: Payer: Self-pay | Admitting: Obstetrics and Gynecology

## 2019-09-13 ENCOUNTER — Other Ambulatory Visit: Payer: Self-pay

## 2019-09-13 ENCOUNTER — Ambulatory Visit (INDEPENDENT_AMBULATORY_CARE_PROVIDER_SITE_OTHER): Payer: 59 | Admitting: Obstetrics and Gynecology

## 2019-09-13 ENCOUNTER — Encounter: Payer: Self-pay | Admitting: Obstetrics and Gynecology

## 2019-09-13 VITALS — BP 118/62 | HR 83 | Ht 66.5 in | Wt 135.2 lb

## 2019-09-13 DIAGNOSIS — N644 Mastodynia: Secondary | ICD-10-CM

## 2019-09-13 NOTE — Progress Notes (Signed)
GYNECOLOGY  VISIT   HPI: 49 y.o.   Single White or Caucasian Not Hispanic or Latino  female   [id number] with Patient's last menstrual period was 08/01/2014 (exact date).   here for   She has pain in her left breast she states that over the weekend she felt a small tender lump.    Her last mammogram was in 6/17, cat d, negative.   GYNECOLOGIC HISTORY: Patient's last menstrual period was 08/01/2014 (exact date). Contraception:PMP Menopausal hormone therapy: none         OB History    Gravida  0   Para  0   Term  0   Preterm  0   AB  0   Living  0     SAB  0   TAB  0   Ectopic  0   Multiple  0   Live Births  0              Patient Active Problem List   Diagnosis Date Noted  . 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 Healthcare maintenance 03/27/2019  . Yeast vaginitis 03/27/2019  . Hyponatremia 09/21/2017  . Abdominal pain 09/21/2017  . Lack of menses 09/21/2017  . Anxiety   . Migraines   . Dysplasia of cervix, low grade (CIN 1) 09/29/2015    Past Medical History:  Diagnosis Date  . Anxiety   . ASCUS favor benign 04/2013   Negative high risk HPV screen. Recommend repeat Pap smear/HPV one year  . 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 Endometrial polyp 04/2013   Removed in the office extruding from the cervical os  . Hormone disorder   . Migraines   . Urinary incontinence     Past Surgical History:  Procedure Laterality Date  . DILATATION & CURETTAGE/HYSTEROSCOPY WITH TRUECLEAR N/A 09/18/2013   Procedure: DILATATION & CURETTAGE/HYSTEROSCOPY WITH TRUCLEAR;  Surgeon: Dara Lords, MD;  Location: [facility];  Service: Gynecology;  Laterality: N/A;  . WISDOM TOOTH EXTRACTION Bilateral     Current Outpatient Medications  Medication Sig Dispense Refill  . acetaminophen (TYLENOL) 325 MG tablet Take 650 mg by mouth every 6 (six) hours as needed for mild pain or headache.    . CRANBERRY PO Take 1 tablet by mouth daily.    . Magnesium 200 MG TABS  Take 200 mg by mouth at bedtime.    . Multiple Vitamin (MULTIVITAMIN WITH MINERALS) TABS tablet Take 1 tablet by mouth daily.    . TURMERIC PO Take 1 capsule by mouth every other day.      No current facility-administered medications for this visit.     ALLERGIES: Codeine  Family History  Problem Relation Age of Onset  . Bipolar disorder Mother   . Mental illness Mother   . Parkinsonism Father   . Hypertension Father   . Cancer Paternal Grandmother        Breast    Social History   Socioeconomic History  . Marital status: Single    Spouse name: Not on file  . Number of children: Not on file  . Years of education: Not on file  . Highest education level: Not on file  Occupational History  . Not on file  Tobacco Use  . Smoking status: Never Smoker  . Smokeless tobacco: Never Used  Vaping Use  . Vaping Use: Never used  Substance and Sexual Activity  . Alcohol use: Yes    Comment: occ  . Drug use: No  .  Sexual activity: Not Currently  Other Topics Concern  . Not on file  Social History Narrative  . Not on file   Social Determinants of Health   Financial Resource Strain:   . Difficulty of Paying Living Expenses:   Food Insecurity:   . Worried About Programme researcher, broadcasting/film/video in the Last Year:   . Barista in the Last Year:   Transportation Needs:   . Freight forwarder (Medical):   Marland Kitchen Lack of Transportation (Non-Medical):   Physical Activity:   . Days of Exercise per Week:   . Minutes of Exercise per Session:   Stress:   . Feeling of Stress :   Social Connections:   . Frequency of Communication with Friends and Family:   . Frequency of Social Gatherings with Friends and Family:   . Attends Religious Services:   . Active Member of Clubs or Organizations:   . Attends Banker Meetings:   Marland Kitchen Marital Status:   Intimate Partner Violence:   . Fear of Current or Ex-Partner:   . Emotionally Abused:   Marland Kitchen Physically Abused:   . Sexually Abused:      Review of Systems  Genitourinary:       Breast pain    PHYSICAL EXAMINATION:    BP 118/62   Pulse 83   Ht 5' 6.5" (1.689 m)   Wt 135 lb 3.2 oz (61.3 kg)   LMP 08/01/2014 (Exact Date)   SpO2 100%   BMI 21.49 kg/m     General appearance: alert, cooperative and appears stated age Breasts: both breasts examined, tender in the upper, outer quadrant of the left breast. I don't appreciate a distinct lump. No skin changes.  No axillary or supraclavicular adenopathy  ASSESSMENT Left breast pain, the patient feels a lump, not clear to me    PLAN Set up diagnostic breast imaging, attention left breast at 1-2 o'clock

## 2019-09-13 NOTE — Patient Instructions (Signed)

## 2019-09-13 NOTE — Progress Notes (Signed)
Patient scheduled while in office for bil;ateral Dx MMG and left breast US, if needed, on 10/01/19 at 1:40pm, arrive at 1:20pm. Patient verbalizes understanding and is agreeable to date and time.  Placed in MMG hold.

## 2019-10-01 ENCOUNTER — Other Ambulatory Visit: Payer: Self-pay

## 2019-10-01 ENCOUNTER — Ambulatory Visit
Admission: RE | Admit: 2019-10-01 | Discharge: 2019-10-01 | Disposition: A | Discharge status: Home / Self Care | Payer: 59 | Source: Ambulatory Visit | Attending: Obstetrics and Gynecology | Admitting: Obstetrics and Gynecology

## 2019-10-01 DIAGNOSIS — N6321 Unspecified lump in the left breast, upper outer quadrant: Secondary | ICD-10-CM | POA: Diagnosis not present

## 2019-10-01 DIAGNOSIS — R922 Inconclusive mammogram: Secondary | ICD-10-CM | POA: Diagnosis not present

## 2019-10-01 DIAGNOSIS — N644 Mastodynia: Secondary | ICD-10-CM

## 2019-10-01 DIAGNOSIS — N6489 Other specified disorders of breast: Secondary | ICD-10-CM | POA: Diagnosis not present

## 2019-10-02 ENCOUNTER — Encounter (HOSPITAL_COMMUNITY): Payer: Self-pay | Admitting: Emergency Medicine

## 2019-10-02 ENCOUNTER — Emergency Department (HOSPITAL_COMMUNITY)
Admission: EM | Admit: 2019-10-02 | Discharge: 2019-10-03 | Disposition: A | Discharge status: Home / Self Care | Payer: 59 | Attending: Emergency Medicine | Admitting: Emergency Medicine

## 2019-10-02 DIAGNOSIS — R0602 Shortness of breath: Secondary | ICD-10-CM | POA: Insufficient documentation

## 2019-10-02 DIAGNOSIS — Z5321 Procedure and treatment not carried out due to patient leaving prior to being seen by health care provider: Secondary | ICD-10-CM | POA: Insufficient documentation

## 2019-10-02 NOTE — ED Triage Notes (Signed)
Patient report SOB onset after using cleaning supplies in closed space. Patient reports history of panic attacks. Appears highly anxious in triage.

## 2019-10-03 NOTE — ED Notes (Signed)
Pt stated she was getting a ride home and they would be here momentarily to pick her up

## 2019-11-14 ENCOUNTER — Ambulatory Visit: Payer: Self-pay | Admitting: *Deleted

## 2019-11-14 DIAGNOSIS — R200 Anesthesia of skin: Secondary | ICD-10-CM | POA: Diagnosis not present

## 2019-11-14 DIAGNOSIS — E871 Hypo-osmolality and hyponatremia: Secondary | ICD-10-CM | POA: Diagnosis not present

## 2019-11-14 DIAGNOSIS — R000 Tachycardia, unspecified: Secondary | ICD-10-CM | POA: Diagnosis not present

## 2019-11-14 DIAGNOSIS — K219 Gastro-esophageal reflux disease without esophagitis: Secondary | ICD-10-CM | POA: Diagnosis not present

## 2019-11-14 DIAGNOSIS — I959 Hypotension, unspecified: Secondary | ICD-10-CM | POA: Diagnosis not present

## 2019-11-14 DIAGNOSIS — R202 Paresthesia of skin: Secondary | ICD-10-CM | POA: Diagnosis not present

## 2019-11-14 DIAGNOSIS — R079 Chest pain, unspecified: Secondary | ICD-10-CM | POA: Diagnosis not present

## 2019-11-14 DIAGNOSIS — R0789 Other chest pain: Secondary | ICD-10-CM | POA: Diagnosis not present

## 2019-11-14 DIAGNOSIS — F419 Anxiety disorder, unspecified: Secondary | ICD-10-CM | POA: Diagnosis not present

## 2019-11-14 DIAGNOSIS — R072 Precordial pain: Secondary | ICD-10-CM | POA: Diagnosis not present

## 2019-11-14 NOTE — Telephone Encounter (Signed)
Called to check on patient, spoke with patient she was currently at the hospital to be evaluated.

## 2019-11-14 NOTE — Telephone Encounter (Signed)
Patient called via community line c/o indigestion. Upper chest tightness and pain going to both arms with tingling. Also with headache and lightheadedness this morning. Pain has been constant for several days. Was taking famotidine for 2 weeks now using Pepto bismol with no improvement. Advised she be evaluated at this time. Patient would hesitate with thoughts and answer and was alone. [facility] and connected to the patient.   Reason for Disposition . [1] Chest pain lasts > 5 minutes AND [2] age > 87  Answer Assessment - Initial Assessment Questions 1. LOCATION: "Where does it hurt?"       Upper chest 2. RADIATION: "Does the pain go anywhere else?" (e.g., into neck, jaw, arms, back)     Left arm 3. ONSET: "When did the chest pain begin?" (Minutes, hours or days)      1st time was around august 23 prior to taking pepcid. 4. PATTERN "Does the pain come and go, or has it been constant since it started?"  "Does it get worse with exertion?"     constant 5. DURATION: "How long does it last" (e.g., seconds, minutes, hours)     Several days 6. SEVERITY: "How bad is the pain?"  (e.g., Scale 1-10; mild, moderate, or severe)    - MILD (1-3): doesn't interfere with normal activities     - MODERATE (4-7): interferes with normal activities or awakens from sleep    - SEVERE (8-10): excruciating pain, unable to do any normal activities       4 7. CARDIAC RISK FACTORS: "Do you have any history of heart problems or risk factors for heart disease?" (e.g., angina, prior heart attack; diabetes, high blood pressure, high cholesterol, smoker, or strong family history of heart disease)     Patient not able to answer this question. 8. PULMONARY RISK FACTORS: "Do you have any history of lung disease?"  (e.g., blood clots in lung, asthma, emphysema, birth control pills)     No. 9. CAUSE: "What do you think is causing the chest pain?"     Unsure. 10. OTHER SYMPTOMS: "Do you have any other  symptoms?" (e.g., dizziness, nausea, vomiting, sweating, fever, difficulty breathing, cough)       Left arm tingling 11. PREGNANCY: "Is there any chance you are pregnant?" "When was your last menstrual period?"       na  Protocols used: CHEST PAIN-A-AH

## 2019-11-15 DIAGNOSIS — R079 Chest pain, unspecified: Secondary | ICD-10-CM | POA: Diagnosis not present

## 2020-03-04 ENCOUNTER — Other Ambulatory Visit (HOSPITAL_COMMUNITY): Payer: Self-pay | Admitting: Physician Assistant

## 2020-03-04 MED FILL — traMADol HCL 50 MG TABS: 50 | 7 days supply | Qty: 28 | Fill #0

## 2020-09-17 ENCOUNTER — Other Ambulatory Visit: Payer: Self-pay | Admitting: Family Medicine

## 2020-09-17 DIAGNOSIS — Z1231 Encounter for screening mammogram for malignant neoplasm of breast: Secondary | ICD-10-CM

## 2020-10-21 ENCOUNTER — Other Ambulatory Visit (HOSPITAL_COMMUNITY): Payer: Self-pay

## 2020-10-21 MED ORDER — FLUCONAZOLE 150 MG PO TABS
150.0000 mg | ORAL_TABLET | Freq: Once | ORAL | 0 refills | Status: AC
  Filled 2020-10-21: qty 1, 1d supply, fill #0

## 2020-10-21 MED ORDER — BUSPIRONE HCL 10 MG PO TABS
10.0000 mg | ORAL_TABLET | Freq: Three times a day (TID) | ORAL | 1 refills | Status: DC | PRN
  Filled 2020-10-21: qty 90, 30d supply, fill #0

## 2020-10-21 MED ORDER — FLUCONAZOLE 150 MG PO TABS
150.0000 mg | ORAL_TABLET | ORAL | 0 refills | Status: DC
  Filled 2020-10-21: qty 2, 3d supply, fill #0

## 2020-10-24 ENCOUNTER — Other Ambulatory Visit (HOSPITAL_COMMUNITY): Payer: Self-pay

## 2020-10-24 MED ORDER — FLUCONAZOLE 150 MG PO TABS
150.0000 mg | ORAL_TABLET | Freq: Once | ORAL | 0 refills | Status: AC
  Filled 2020-10-24: qty 1, 1d supply, fill #0

## 2020-10-30 ENCOUNTER — Other Ambulatory Visit (HOSPITAL_COMMUNITY): Payer: Self-pay

## 2020-11-10 ENCOUNTER — Ambulatory Visit: Payer: Medicaid Other

## 2020-11-10 ENCOUNTER — Other Ambulatory Visit: Payer: Self-pay

## 2021-02-02 ENCOUNTER — Other Ambulatory Visit (HOSPITAL_COMMUNITY): Payer: Self-pay

## 2021-02-02 MED ORDER — PREMARIN 0.625 MG/GM VA CREA
TOPICAL_CREAM | VAGINAL | 3 refills | Status: DC
  Filled 2021-02-02: qty 30, 70d supply, fill #0

## 2021-02-18 ENCOUNTER — Other Ambulatory Visit (HOSPITAL_COMMUNITY): Payer: Self-pay

## 2021-02-18 MED ORDER — PROMETHAZINE-DM 6.25-15 MG/5ML PO SYRP
ORAL_SOLUTION | ORAL | 0 refills | Status: DC
  Filled 2021-02-18: qty 180, 9d supply, fill #0

## 2021-03-04 ENCOUNTER — Other Ambulatory Visit (HOSPITAL_COMMUNITY): Payer: Self-pay

## 2021-03-04 MED ORDER — PREDNISONE 20 MG PO TABS
ORAL_TABLET | ORAL | 0 refills | Status: DC
  Filled 2021-03-04: qty 5, 5d supply, fill #0

## 2021-03-04 MED ORDER — DOXYCYCLINE HYCLATE 100 MG PO CAPS
ORAL_CAPSULE | ORAL | 0 refills | Status: DC
  Filled 2021-03-04: qty 20, 10d supply, fill #0

## 2021-03-21 IMAGING — US US BREAST*L* LIMITED INC AXILLA
1 series · 2 of 2 positions shown · non-contrast
Comparison: Previous exam(s).

CLINICAL DATA: 49-year-old female presenting for evaluation of a
tender palpable lump in the upper-outer left breast identified on
self breast exam less than a month ago.

EXAM:
DIGITAL DIAGNOSTIC BILATERAL MAMMOGRAM WITH TOMO AND CAD; ULTRASOUND
LEFT BREAST LIMITED

[Series 1: us breast*left* limited inc axilla · 0.06mm/px · 2 of 2 slices shown]
[im 1/2]
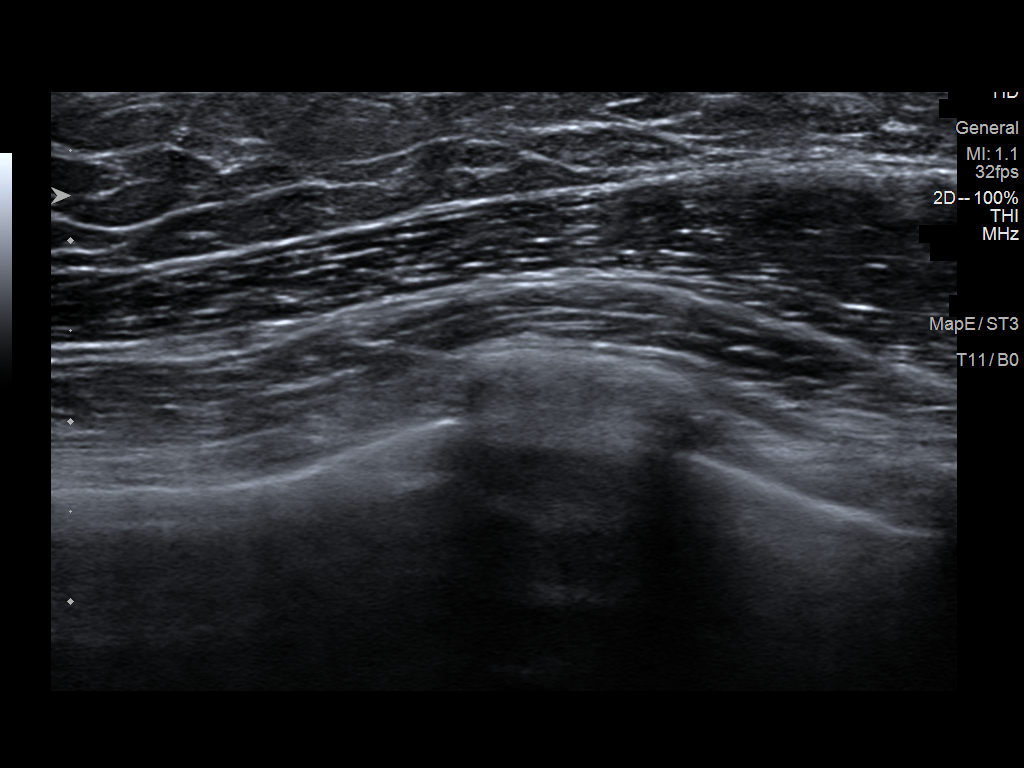
[im 2/2]
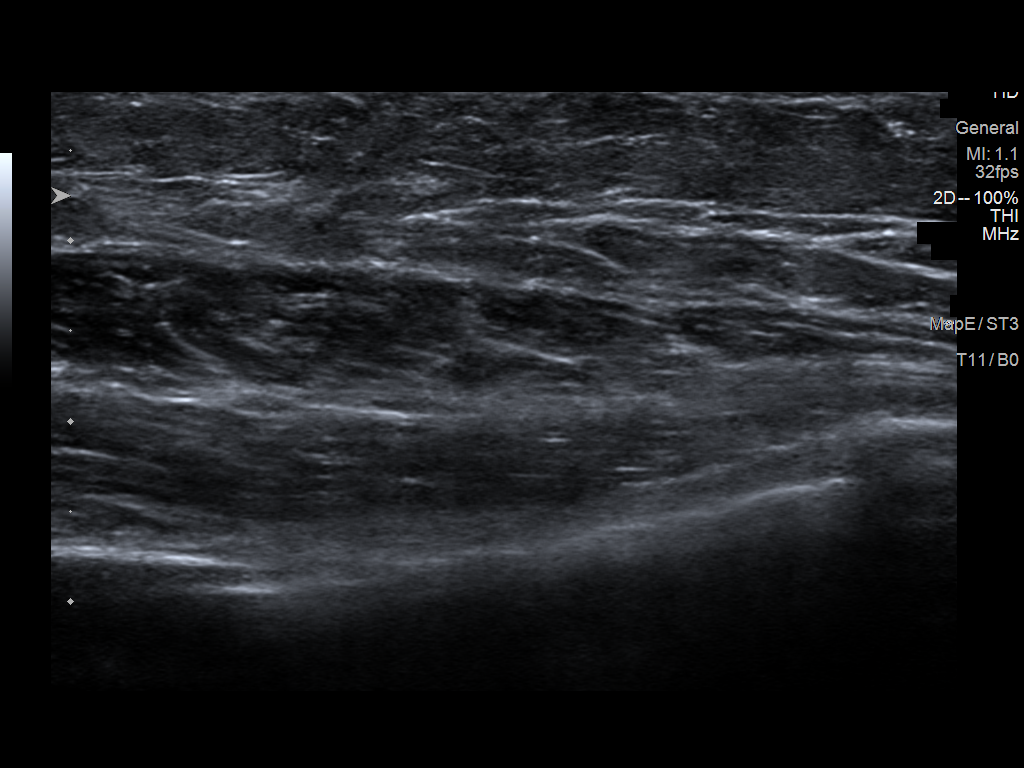

[2 of 2 positions shown; findings below may reference images not displayed]

ACR Breast Density Category c: The breast tissue is heterogeneously
dense, which may obscure small masses.
FINDINGS: No suspicious calcifications, masses or areas of distortion are seen
in the bilateral breasts. No findings are seen deep to the skin
marker indicating the palpable site on the superior left breast on
the spot compression tomosynthesis images.

Mammographic images were processed with CAD.

No discrete palpable masses are identified on exam of the
upper-outer left breast.

Ultrasound targeted to the left breast at 1 o'clock, 9 cm from the
nipple at the palpable site of concern demonstrates normal
fibroglandular tissue. No suspicious masses or areas of shadowing
are identified. Ultrasound of the left axilla was performed per
patient request though she is not feeling a lump or pain,
demonstrates normal subcutaneous tissue. No suspicious lymph nodes
or other masses are identified.
IMPRESSION: 1. There are no suspicious mammographic or targeted sonographic
abnormalities at the palpable site of concern in the upper-outer
left breast.

2.  No mammographic evidence of malignancy in the bilateral breasts.

RECOMMENDATION:
1. Clinical follow-up recommended for the palpable area of concern
in the upper-outer left breast. Any further workup should be based
on clinical grounds.

2.  Screening mammogram in one year.(Code:[id number])

I have discussed the findings and recommendations with the patient.
If applicable, a reminder letter will be sent to the patient
regarding the next appointment.

BI-RADS CATEGORY  1: Negative.

## 2021-08-24 ENCOUNTER — Other Ambulatory Visit (HOSPITAL_COMMUNITY): Payer: Self-pay

## 2021-08-24 MED ORDER — ONDANSETRON HCL 4 MG PO TABS
4.0000 mg | ORAL_TABLET | Freq: Three times a day (TID) | ORAL | 0 refills | Status: DC | PRN
  Filled 2021-08-24: qty 20, 7d supply, fill #0

## 2021-08-31 ENCOUNTER — Other Ambulatory Visit (HOSPITAL_COMMUNITY): Payer: Self-pay

## 2021-08-31 MED ORDER — OMEPRAZOLE 20 MG PO CPDR
20.0000 mg | DELAYED_RELEASE_CAPSULE | Freq: Every day | ORAL | 1 refills | Status: DC
  Filled 2021-08-31: qty 30, 30d supply, fill #0

## 2021-09-03 ENCOUNTER — Emergency Department (HOSPITAL_COMMUNITY): Payer: BLUE CROSS/BLUE SHIELD

## 2021-09-03 ENCOUNTER — Emergency Department (HOSPITAL_COMMUNITY)
Admission: EM | Admit: 2021-09-03 | Discharge: 2021-09-03 | Disposition: A | Discharge status: Home / Self Care | Payer: BLUE CROSS/BLUE SHIELD | Attending: Emergency Medicine | Admitting: Emergency Medicine

## 2021-09-03 ENCOUNTER — Other Ambulatory Visit: Payer: Self-pay

## 2021-09-03 DIAGNOSIS — R110 Nausea: Secondary | ICD-10-CM | POA: Diagnosis not present

## 2021-09-03 DIAGNOSIS — F17200 Nicotine dependence, unspecified, uncomplicated: Secondary | ICD-10-CM | POA: Diagnosis not present

## 2021-09-03 DIAGNOSIS — R0602 Shortness of breath: Secondary | ICD-10-CM | POA: Diagnosis not present

## 2021-09-03 DIAGNOSIS — R0789 Other chest pain: Secondary | ICD-10-CM | POA: Insufficient documentation

## 2021-09-03 DIAGNOSIS — R079 Chest pain, unspecified: Secondary | ICD-10-CM

## 2021-09-03 LAB — COMPREHENSIVE METABOLIC PANEL
ALT: 17 U/L (ref 0–44)
AST: 20 U/L (ref 15–41)
Albumin: 3.7 g/dL (ref 3.5–5.0)
Alkaline Phosphatase: 78 U/L (ref 38–126)
Anion gap: 13 (ref 5–15)
BUN: 12 mg/dL (ref 6–20)
CO2: 22 mmol/L (ref 22–32)
Calcium: 9 mg/dL (ref 8.9–10.3)
Chloride: 100 mmol/L (ref 98–111)
Creatinine, Ser: 0.75 mg/dL (ref 0.44–1.00)
GFR, Estimated: >60 mL/min (ref >60)
Glucose, Bld: 114 mg/dL — ABNORMAL HIGH (ref 70–99)
Potassium: 3.3 mmol/L — ABNORMAL LOW (ref 3.5–5.1)
Sodium: 135 mmol/L (ref 135–145)
Total Bilirubin: 0.3 mg/dL (ref 0.3–1.2)
Total Protein: 5.9 g/dL — ABNORMAL LOW (ref 6.5–8.1)

## 2021-09-03 LAB — CBC WITH DIFFERENTIAL/PLATELET
Abs Immature Granulocytes: 0.01 10*3/uL (ref 0.00–0.07)
Basophils Absolute: 0.1 10*3/uL (ref 0.0–0.1)
Basophils Relative: 1 %
Eosinophils Absolute: 0.2 10*3/uL (ref 0.0–0.5)
Eosinophils Relative: 3 %
HCT: 34.4 % — ABNORMAL LOW (ref 36.0–46.0)
Hemoglobin: 11.4 g/dL — ABNORMAL LOW (ref 12.0–15.0)
Immature Granulocytes: 0 %
Lymphocytes Relative: 41 %
Lymphs Abs: 2.6 10*3/uL (ref 0.7–4.0)
MCH: 30.1 pg (ref 26.0–34.0)
MCHC: 33.1 g/dL (ref 30.0–36.0)
MCV: 90.8 fL (ref 80.0–100.0)
Monocytes Absolute: 0.5 10*3/uL (ref 0.1–1.0)
Monocytes Relative: 8 %
Neutro Abs: 2.9 10*3/uL (ref 1.7–7.7)
Neutrophils Relative %: 47 %
Platelets: 248 10*3/uL (ref 150–400)
RBC: 3.79 MIL/uL — ABNORMAL LOW (ref 3.87–5.11)
RDW: 12.3 % (ref 11.5–15.5)
WBC: 6.2 10*3/uL (ref 4.0–10.5)
nRBC: 0 % (ref 0.0–0.2)

## 2021-09-03 LAB — BRAIN NATRIURETIC PEPTIDE: B Natriuretic Peptide: 37.8 pg/mL (ref 0.0–100.0)

## 2021-09-03 LAB — TROPONIN I (HIGH SENSITIVITY)
Troponin I (High Sensitivity): 14 ng/L (ref <18)
Troponin I (High Sensitivity): 11 ng/L (ref <18)

## 2021-09-03 MED ORDER — NITROGLYCERIN 0.4 MG SL SUBL: 0.4000 mg | SUBLINGUAL_TABLET | SUBLINGUAL | Status: DC | PRN

## 2021-09-03 MED ORDER — ONDANSETRON HCL 4 MG/2ML IJ SOLN
4.0000 mg | Freq: Once | INTRAMUSCULAR | Status: AC
  Administered 2021-09-03: 4 mg via INTRAVENOUS
  Filled 2021-09-03: qty 2

## 2021-09-03 MED ORDER — ONDANSETRON HCL 4 MG/2ML IJ SOLN
4.0000 mg | Freq: Once | INTRAMUSCULAR | Status: AC
Start: 2021-09-03 — End: 2021-09-03
  Administered 2021-09-03: 4 mg via INTRAVENOUS
  Filled 2021-09-03: qty 2

## 2021-09-03 NOTE — ED Triage Notes (Signed)
Pt c/o sudden, substernal CP onset 4p at rest 3xNTG, mild relief Hx MI 6/23  101/68 HR 98 fentanyl, fluid bolus 18g L FA, 20RAC

## 2021-09-03 NOTE — ED Notes (Signed)
Patient stable and ambulatory at time of discharge. 

## 2021-09-03 NOTE — ED Provider Triage Note (Signed)
Emergency Medicine Provider Triage Evaluation Note  SWAYZEE WADLEY , a 51 y.o. female  was evaluated in triage.  Pt complains of chest pain.  Pain began at approximately 4 PM today.  Did have some improvement with third dose of nitroglycerin that she took at home.  Denies any shortness of breath.  Reports having nausea as well after being given fentanyl by EMS.  Patient underwent a cardiac catheterization on 08/23/2021.  She was evaluated by her cardiologist this morning and was given a prescription for nitroglycerin.  Denies any leg swelling.  No abdominal pain or vomiting.  Review of Systems  Positive: Chest pain Negative: Abdominal pain, shortness of breath  Physical Exam  LMP 08/01/2014 (Exact Date)  Gen:   Awake, no distress   Resp:  Normal effort  MSK:   Moves extremities without difficulty  Other:  Appears uncomfortable  Medical Decision Making  Medically screening exam initiated at 5:49 PM.  Appropriate orders placed.  BRACHA FRANKOWSKI was informed that the remainder of the evaluation will be completed by another provider, this initial triage assessment does not replace that evaluation, and the importance of remaining in the ED until their evaluation is complete.  Labs and ekg ordered   Dietrich Pates, [place] 09/03/21 1750

## 2021-09-03 NOTE — ED Provider Notes (Signed)
[facility] EMERGENCY DEPARTMENT Provider Note   CSN: [id number] Arrival date & time: 09/03/21  1733     History  Chief Complaint  Patient presents with   Chest Pain    Ashley [name] is a 51 y.o. female.  HPI    51 year old female with a history of recent admission for NSTEMI June 24 to June 26 with a troponin peaking at 21,000, for which she had coronary angiography performed on June 25 which showed normal coronary arteries but slow runoff, considered myocarditis who presents with concern for chest pain.  She was seen by Dr. Melina Modena with [facility] cardiology today.  Got nitro today to use  Developed pressure on the chest today Felt different  than heart burn Took 3 nitro, thinks third one did help, CP down 8 or 9 to 7 Had associated shortness of breath Nausea thinks from fentanyl on the truck Symptoms started 4PM Nothing seems to makes pain better or worse, doesn't change with position, exertion or deep breaths No leg pain or swelling  No recent surgeries/long trips/not on estrogen No family hx of early heart disease or blood clots. Aunt and grandfather had heart issues but not heart attack Live with smoker, smokes outside, no smoking no etoh or other drugs No dyspnea now, no cp now like before, feels like heart burn on reevaluation   Past Medical History:  Diagnosis Date   Anxiety    ASCUS favor benign 04/2013   Negative high risk HPV screen. Recommend repeat Pap smear/HPV one year   Dysplasia of cervix, low grade (CIN 1) 09/29/2015   Endometrial polyp 04/2013   Removed in the office extruding from the cervical os   Hormone disorder    Migraines    Urinary incontinence      Home Medications Prior to Admission medications   Medication Sig Start Date End Date Taking? Authorizing Provider  acetaminophen (TYLENOL) 325 MG tablet Take 650 mg by mouth every 6 (six) hours as needed for mild pain or headache.   Yes [provider]   amLODipine (NORVASC) 5 MG tablet Take 5 mg by mouth daily.   Yes [provider]  CRANBERRY PO Take 1 tablet by mouth daily.   Yes [provider]  famotidine (PEPCID) 20 MG tablet Take 20 mg by mouth 2 (two) times daily.   Yes [provider]  Magnesium 200 MG TABS Take 200 mg by mouth at bedtime.   Yes [provider]  Multiple Vitamin (MULTIVITAMIN WITH MINERALS) TABS tablet Take 1 tablet by mouth daily.   Yes [provider]  nitroGLYCERIN (NITROSTAT) 0.4 MG SL tablet Place 0.4 mg under the tongue every 5 (five) minutes as needed for chest pain. 09/03/21  Yes [provider]  ondansetron (ZOFRAN) 4 MG tablet Take 1 tablet (4 mg total) by mouth every 8 (eight) hours as needed for nausea/vomiting Patient taking differently: Take 4 mg by mouth every 8 (eight) hours as needed for vomiting or nausea. 08/24/21  Yes   TURMERIC PO Take 1 capsule by mouth every other day.    Yes [provider]  omeprazole (PRILOSEC) 20 MG capsule Take 1 capsule (20 mg total) by mouth daily. Patient not taking: Reported on 09/03/2021 08/31/21     predniSONE (DELTASONE) 20 MG tablet Take 1 tablet (20 mg total) by mouth daily for 5 days. Patient not taking: Reported on 09/03/2021 03/04/21         Allergies    Codeine,  Doxycycline hyclate, and Promethazine-dm    Review of Systems   Review of Systems  Physical Exam Updated Vital Signs BP 112/61 (BP Location: Right Arm)   Pulse 73   Temp (!) 97.5 F (36.4 C) (Oral)   Resp 17   LMP 08/01/2014 (Exact Date)   SpO2 98%  Physical Exam Vitals and nursing note reviewed.  Constitutional:      General: She is not in acute distress.    Appearance: She is well-developed. She is not diaphoretic.  HENT:     Head: Normocephalic and atraumatic.  Eyes:     Conjunctiva/sclera: Conjunctivae normal.  Cardiovascular:     Rate and Rhythm: Normal rate and regular rhythm.     Heart sounds: Normal heart sounds. No murmur  heard.    No friction rub. No gallop.  Pulmonary:     Effort: Pulmonary effort is normal. No respiratory distress.     Breath sounds: Normal breath sounds. No wheezing or rales.  Abdominal:     General: There is no distension.     Palpations: Abdomen is soft.     Tenderness: There is no abdominal tenderness. There is no guarding.  Musculoskeletal:        General: No tenderness.     Cervical back: Normal range of motion.  Skin:    General: Skin is warm and dry.     Findings: No erythema or rash.  Neurological:     Mental Status: She is alert and oriented to person, place, and time.     ED Results / Procedures / Treatments   Labs (all labs ordered are listed, but only abnormal results are displayed) Labs Reviewed  CBC WITH DIFFERENTIAL/PLATELET - Abnormal; Notable for the following components:      Result Value   RBC 3.79 (*)    Hemoglobin 11.4 (*)    HCT 34.4 (*)    All other components within normal limits  COMPREHENSIVE METABOLIC PANEL - Abnormal; Notable for the following components:   Potassium 3.3 (*)    Glucose, Bld 114 (*)    Total Protein 5.9 (*)    All other components within normal limits  BRAIN NATRIURETIC PEPTIDE  TROPONIN I (HIGH SENSITIVITY)  TROPONIN I (HIGH SENSITIVITY)    EKG EKG Interpretation  Date/Time:  Thursday September 03 2021 17:57:01 EDT Ventricular Rate:  63 PR Interval:  140 QRS Duration: 102 QT Interval:  428 QTC Calculation: 437 R Axis:   94 Text Interpretation: Normal sinus rhythm Rightward axis Borderline ECG When compared with ECG of 02-Oct-2019 22:54,  TW peaked V3 Confirmed by Alvira Monday ([id number]) on 09/03/2021 6:16:06 PM  Radiology DG Chest 2 View  Result Date: 09/03/2021 CLINICAL DATA:  History of recent myocardial infarction with nausea and chest pain, initial encounter EXAM: CHEST - 2 VIEW COMPARISON:  08/22/2021 FINDINGS: Cardiac shadow is within limits. The lungs are well aerated bilaterally. Focal infiltrate or effusion is  seen. No bony abnormality is noted. IMPRESSION: No active cardiopulmonary disease. Electronically Signed   By: Alcide Clever M.D.   On: 09/03/2021 19:43    Procedures Procedures    Medications Ordered in ED Medications  ondansetron (ZOFRAN) injection 4 mg (4 mg Intravenous Given 09/03/21 1753)  ondansetron (ZOFRAN) injection 4 mg (4 mg Intravenous Given 09/03/21 1955)    ED Course/ Medical Decision Making/ A&P                           Medical  Decision Making Amount and/or Complexity of Data Reviewed Labs: ordered.  Risk Prescription drug management.   51 year old female with a history of recent admission for NSTEMI June 24 to June 26 with a troponin peaking at 21,000, for which she had coronary angiography performed on June 25 which showed normal coronary arteries but slow runoff, considered myocarditis who presents with concern for chest pain.  Differential diagnosis for chest pain includes pulmonary embolus, dissection, pneumothorax, pneumonia, ACS, myocarditis, pericarditis.  EKG was done and evaluate by me and showed no acute ST changes and no signs of pericarditis, tw slightly peaked anteriorly in comparison to prior. Chest x-ray was done and evaluated by me and radiology and showed no sign of pneumonia or pneumothorax.  Symptoms improved, no hypoxia, no tachypnea, no tachycardia, no asymmetric leg swelling, no long travel/surgeries/hx of DVT, and have low suspicion for PE.  Do not feel history or exam are consistent with aortic dissection.  Had recent CTA chest abdomen pelvis without abnormalities.   Delta troponins both negative and no signs of ACS. Had recent catheterization without signs of CAD, possible she had coronary spasm as etiology of prior troponin elevation. Feel she is stable for outpatient follow up with Cardiology, return for new or worsening symptoms.            Final Clinical Impression(s) / ED Diagnoses Final diagnoses:  Nonspecific chest pain    Rx / DC  Orders ED Discharge Orders     None         Gareth Morgan, MD 09/04/21 1209

## 2021-10-11 ENCOUNTER — Emergency Department (HOSPITAL_COMMUNITY)
Admission: EM | Admit: 2021-10-11 | Discharge: 2021-10-11 | Disposition: A | Discharge status: Home / Self Care | Payer: BLUE CROSS/BLUE SHIELD | Attending: Emergency Medicine | Admitting: Emergency Medicine

## 2021-10-11 ENCOUNTER — Other Ambulatory Visit: Payer: Self-pay

## 2021-10-11 ENCOUNTER — Emergency Department (HOSPITAL_COMMUNITY): Payer: BLUE CROSS/BLUE SHIELD

## 2021-10-11 DIAGNOSIS — I129 Hypertensive chronic kidney disease with stage 1 through stage 4 chronic kidney disease, or unspecified chronic kidney disease: Secondary | ICD-10-CM | POA: Diagnosis not present

## 2021-10-11 DIAGNOSIS — N9489 Other specified conditions associated with female genital organs and menstrual cycle: Secondary | ICD-10-CM | POA: Diagnosis not present

## 2021-10-11 DIAGNOSIS — R7309 Other abnormal glucose: Secondary | ICD-10-CM | POA: Diagnosis not present

## 2021-10-11 DIAGNOSIS — N189 Chronic kidney disease, unspecified: Secondary | ICD-10-CM | POA: Insufficient documentation

## 2021-10-11 DIAGNOSIS — R0789 Other chest pain: Secondary | ICD-10-CM | POA: Diagnosis present

## 2021-10-11 DIAGNOSIS — R112 Nausea with vomiting, unspecified: Secondary | ICD-10-CM | POA: Insufficient documentation

## 2021-10-11 DIAGNOSIS — R079 Chest pain, unspecified: Secondary | ICD-10-CM

## 2021-10-11 LAB — BASIC METABOLIC PANEL
Anion gap: 11 (ref 5–15)
BUN: 11 mg/dL (ref 6–20)
CO2: 23 mmol/L (ref 22–32)
Calcium: 8.9 mg/dL (ref 8.9–10.3)
Chloride: 98 mmol/L (ref 98–111)
Creatinine, Ser: 0.83 mg/dL (ref 0.44–1.00)
GFR, Estimated: >60 mL/min (ref >60)
Glucose, Bld: 158 mg/dL — ABNORMAL HIGH (ref 70–99)
Potassium: 3.2 mmol/L — ABNORMAL LOW (ref 3.5–5.1)
Sodium: 132 mmol/L — ABNORMAL LOW (ref 135–145)

## 2021-10-11 LAB — TROPONIN I (HIGH SENSITIVITY)
Troponin I (High Sensitivity): 6 ng/L (ref <18)
Troponin I (High Sensitivity): 8 ng/L (ref <18)

## 2021-10-11 LAB — CBG MONITORING, ED: Glucose-Capillary: 124 mg/dL — ABNORMAL HIGH (ref 70–99)

## 2021-10-11 LAB — CBC
HCT: 35.2 % — ABNORMAL LOW (ref 36.0–46.0)
Hemoglobin: 11.8 g/dL — ABNORMAL LOW (ref 12.0–15.0)
MCH: 31 pg (ref 26.0–34.0)
MCHC: 33.5 g/dL (ref 30.0–36.0)
MCV: 92.4 fL (ref 80.0–100.0)
Platelets: 205 10*3/uL (ref 150–400)
RBC: 3.81 MIL/uL — ABNORMAL LOW (ref 3.87–5.11)
RDW: 12.4 % (ref 11.5–15.5)
WBC: 5.3 10*3/uL (ref 4.0–10.5)
nRBC: 0 % (ref 0.0–0.2)

## 2021-10-11 LAB — I-STAT BETA HCG BLOOD, ED (MC, WL, AP ONLY): I-stat hCG, quantitative: <5 m[IU]/mL (ref <5)

## 2021-10-11 MED ORDER — ASPIRIN 81 MG PO CHEW: 324.0000 mg | CHEWABLE_TABLET | Freq: Once | ORAL | Status: DC

## 2021-10-11 MED ORDER — ONDANSETRON 4 MG PO TBDP
4.0000 mg | ORAL_TABLET | Freq: Once | ORAL | Status: AC
  Administered 2021-10-11: 4 mg via ORAL
  Filled 2021-10-11: qty 1

## 2021-10-11 MED ORDER — ONDANSETRON HCL 4 MG PO TABS: 4.0000 mg | ORAL_TABLET | Freq: Three times a day (TID) | ORAL | 0 refills | Status: AC | PRN

## 2021-10-11 MED ORDER — LORAZEPAM 2 MG/ML IJ SOLN
1.0000 mg | Freq: Once | INTRAMUSCULAR | Status: AC
  Administered 2021-10-11: 1 mg via INTRAVENOUS
  Filled 2021-10-11: qty 1

## 2021-10-11 NOTE — ED Provider Notes (Signed)
[facility] EMERGENCY DEPARTMENT Provider Note   CSN: [id number] Arrival date & time: 10/11/21  1624     History  Chief Complaint  Patient presents with   Chest Pain    Ashley [name] is a 51 y.o. female.   Chest Pain  This patient is a 51 year old female, she has a history of acid reflux on omeprazole, she is on amlodipine which was recently prescribed after hospitalization in June when she was told that she had a myocardial infarction.  He actually underwent a heart catheterization during that hospitalization in June and had clean coronary arteries despite having a troponin that was 21,000.  She had a normal echocardiogram, she had a normal A1c of 5.7.  The patient reports to me that she was in her usual state of health.  She took a CBD gummy, she had intercourse and shortly thereafter developed this throbbing chest discomfort, she then became very nauseated on arrival.  At this time the patient is having both the throbbing discomfort as well as the nausea.  She reports that she has panic attacks and usually the symptoms do not last as long as this 1 has lasted.  I have personally reviewed the medical record including the notes from the outside hospital documenting the heart catheterization that was clean in June 2023      Home Medications Prior to Admission medications   Medication Sig Start Date End Date Taking? Authorizing Provider  acetaminophen (TYLENOL) 325 MG tablet Take 650 mg by mouth every 6 (six) hours as needed for mild pain or headache.    [provider]  amLODipine (NORVASC) 5 MG tablet Take 5 mg by mouth daily.    [provider]  CRANBERRY PO Take 1 tablet by mouth daily.    [provider]  famotidine (PEPCID) 20 MG tablet Take 20 mg by mouth 2 (two) times daily.    [provider]  Magnesium 200 MG TABS Take 200 mg by mouth at bedtime.    [provider]  Multiple Vitamin (MULTIVITAMIN WITH  MINERALS) TABS tablet Take 1 tablet by mouth daily.    [provider]  nitroGLYCERIN (NITROSTAT) 0.4 MG SL tablet Place 0.4 mg under the tongue every 5 (five) minutes as needed for chest pain. 09/03/21   [provider]  ondansetron (ZOFRAN) 4 MG tablet Take 1 tablet (4 mg total) by mouth every 8 (eight) hours as needed for nausea/vomiting 10/11/21   Eber Hong, MD  TURMERIC PO Take 1 capsule by mouth every other day.     [provider]  omeprazole (PRILOSEC) 20 MG capsule Take 1 capsule (20 mg total) by mouth daily. Patient not taking: Reported on 09/03/2021 08/31/21 09/09/21        Allergies    Codeine, Doxycycline hyclate, and Promethazine-dm    Review of Systems   Review of Systems  Cardiovascular:  Positive for chest pain.  All other systems reviewed and are negative.   Physical Exam Updated Vital Signs BP 114/67   Pulse 83   Temp 97.9 F (36.6 C) (Oral)   Resp 19   Ht 1.676 m (5\' 6" )   Wt 63.5 kg   LMP 08/01/2014 (Exact Date)   SpO2 98%   BMI 22.60 kg/m  Physical Exam Vitals and nursing note reviewed.  Constitutional:      General: She is not in acute distress.    Appearance: She is well-developed.  HENT:     Head: Normocephalic and  atraumatic.     Mouth/Throat:     Pharynx: No oropharyngeal exudate.  Eyes:     General: No scleral icterus.       Right eye: No discharge.        Left eye: No discharge.     Conjunctiva/sclera: Conjunctivae normal.     Pupils: Pupils are equal, round, and reactive to light.  Neck:     Thyroid: No thyromegaly.     Vascular: No JVD.  Cardiovascular:     Rate and Rhythm: Normal rate and regular rhythm.     Heart sounds: Normal heart sounds. No murmur heard.    No friction rub. No gallop.  Pulmonary:     Effort: Pulmonary effort is normal. No respiratory distress.     Breath sounds: Normal breath sounds. No wheezing or rales.  Abdominal:     General: Bowel sounds are normal. There is no distension.      Palpations: Abdomen is soft. There is no mass.     Tenderness: There is no abdominal tenderness.  Musculoskeletal:        General: No tenderness. Normal range of motion.     Cervical back: Normal range of motion and neck supple.     Right lower leg: No edema.     Left lower leg: No edema.  Lymphadenopathy:     Cervical: No cervical adenopathy.  Skin:    General: Skin is warm and dry.     Findings: No erythema or rash.  Neurological:     General: No focal deficit present.     Mental Status: She is alert.     Coordination: Coordination normal.  Psychiatric:        Behavior: Behavior normal.     Comments: Anxious and upset in appearance, able to speak but speaks very softly, does not make good eye contact     ED Results / Procedures / Treatments   Labs (all labs ordered are listed, but only abnormal results are displayed) Labs Reviewed  BASIC METABOLIC PANEL - Abnormal; Notable for the following components:      Result Value   Sodium 132 (*)    Potassium 3.2 (*)    Glucose, Bld 158 (*)    All other components within normal limits  CBC - Abnormal; Notable for the following components:   RBC 3.81 (*)    Hemoglobin 11.8 (*)    HCT 35.2 (*)    All other components within normal limits  CBG MONITORING, ED - Abnormal; Notable for the following components:   Glucose-Capillary 124 (*)    All other components within normal limits  I-STAT BETA HCG BLOOD, ED ([facility], [facility], [facility] ONLY)  TROPONIN I (HIGH SENSITIVITY)  TROPONIN I (HIGH SENSITIVITY)    EKG EKG Interpretation  Date/Time:  Sunday October 11 2021 16:36:18 EDT Ventricular Rate:  85 PR Interval:  161 QRS Duration: 107 QT Interval:  387 QTC Calculation: 461 R Axis:   97 Text Interpretation: Sinus rhythm Biatrial enlargement Borderline right axis deviation Since last tracing rate faster Confirmed by Noemi Chapel [phone]) on 10/11/2021 4:53:18 PM  Radiology DG Chest Portable 1 View  Result Date: 10/11/2021 CLINICAL DATA:   Chest pain EXAM: PORTABLE CHEST 1 VIEW COMPARISON:  Radiographs 09/03/2021 FINDINGS: No focal consolidation, pleural effusion, or pneumothorax. Normal cardiomediastinal silhouette. No acute osseous abnormality. IMPRESSION: No active disease. Electronically Signed   By: Placido Sou M.D.   On: 10/11/2021 17:05    Procedures Procedures    Medications  Ordered in ED Medications  aspirin chewable tablet 324 mg (324 mg Oral Not Given 10/11/21 1702)  ondansetron (ZOFRAN-ODT) disintegrating tablet 4 mg (has no administration in time range)  LORazepam (ATIVAN) injection 1 mg (1 mg Intravenous Given 10/11/21 1702)    ED Course/ Medical Decision Making/ A&P                           Medical Decision Making Amount and/or Complexity of Data Reviewed Labs: ordered. Radiology: ordered.  Risk OTC drugs. Prescription drug management.   This patient presents to the ED for concern of chest discomfort and nausea after taking a CBD gummy, having intercourse, all in the presence of having a recent myocardial infarction of unknown etiology, this involves an extensive number of treatment options, and is a complaint that carries with it a high risk of complications and morbidity.  The differential diagnosis includes potential panic attack, consider recurrent MI, consider anxiety, consider acid reflux that seems less likely, could be related to the CBD gummy that she took   Co morbidities that complicate the patient evaluation  Hypertension, anxiety, substance use of sorts with CKD   Additional history obtained:  Additional history obtained from electronic External records from outside source obtained and reviewed including catheterization and hospitalization report from June 2023   Lab Tests:  I Ordered, and personally interpreted labs.  The pertinent results include:  trop and repeat trop neg, glucose normal -minimal hyponatremia and hypokalemia though this seems to be better over time for this  patient.  Blood counts without any significant anemia, no leukocytosis   Imaging Studies ordered:  I ordered imaging studies including chest x-ray I independently visualized and interpreted imaging which showed no acute findings I agree with the radiologist interpretation   Cardiac Monitoring: / EKG:  The patient was maintained on a cardiac monitor.  I personally viewed and interpreted the cardiac monitored which showed an underlying rhythm of: Normal sinus rhythm   Consultations Obtained:  Patient's symptoms improved significantly without any evidence of acute cardiac ischemia or other abnormal findings consultation was not required   Problem List / ED Course / Critical interventions / Medication management  The patient's symptoms have improved significantly, I suspect this is related to her CBD use, there is no signs of acute ischemia on the lab work or on the EKG and with her normal coronary exam recently 2 months ago it makes acute obstructive disease extremely unlikely as well as pulmonary embolism or other sources of chest pain. In fact at discharge her blood pressure is 114/67 with a normal temperature and a pulse of 83 I ordered medication including ondansetron for nausea Reevaluation of the patient after these medicines showed that the patient improved I have reviewed the patients home medicines and have made adjustments as needed   Social Determinants of Health:  Use of CBD Gummies   Test / Admission - Considered:  Considered admission but the patient is low risk for coronary disease given recent work-up, stable for discharge  I have discussed with the patient at the bedside the results, and the meaning of these results.  They have expressed her understanding to the need for follow-up with primary care physician        Final Clinical Impression(s) / ED Diagnoses Final diagnoses:  Chest pain, unspecified type  Nausea and vomiting, unspecified vomiting type     Rx / DC Orders ED Discharge Orders  Ordered    ondansetron (ZOFRAN) 4 MG tablet  Every 8 hours PRN        10/11/21 2116              Noemi Chapel, MD 10/11/21 2120

## 2021-10-11 NOTE — Discharge Instructions (Signed)
Your testing has not shown any problems with your heart in fact your blood work was very reassuring!  I would encourage you to not use the CBD gummies, as they may have caused your symptoms  Thank you for allowing [place] to treat you in the emergency department today.  After reviewing your examination and potential testing that was done it appears that you are safe to go home.  I would like for you to follow-up with your doctor within the next several days, have them obtain your results and follow-up with them to review all of these tests.  If you should develop severe or worsening symptoms return to the emergency department immediately

## 2021-10-11 NOTE — ED Triage Notes (Signed)
Pt BIB EMS due to chest pain, n/v. Pt has hx of MI and states she feels like it is the same chest pain. Pt had 324 of aspirin, 1 nitro. VSS pt is axox4.

## 2021-12-03 ENCOUNTER — Emergency Department (HOSPITAL_COMMUNITY): Payer: BLUE CROSS/BLUE SHIELD

## 2021-12-03 ENCOUNTER — Encounter (HOSPITAL_COMMUNITY): Payer: Self-pay

## 2021-12-03 ENCOUNTER — Emergency Department (HOSPITAL_COMMUNITY)
Admission: EM | Admit: 2021-12-03 | Discharge: 2021-12-03 | Disposition: A | Discharge status: Home / Self Care | Payer: BLUE CROSS/BLUE SHIELD | Attending: Emergency Medicine | Admitting: Emergency Medicine

## 2021-12-03 DIAGNOSIS — R0789 Other chest pain: Secondary | ICD-10-CM | POA: Insufficient documentation

## 2021-12-03 DIAGNOSIS — R000 Tachycardia, unspecified: Secondary | ICD-10-CM | POA: Insufficient documentation

## 2021-12-03 DIAGNOSIS — K219 Gastro-esophageal reflux disease without esophagitis: Secondary | ICD-10-CM

## 2021-12-03 LAB — CBC WITH DIFFERENTIAL/PLATELET
Abs Immature Granulocytes: 0.01 10*3/uL (ref 0.00–0.07)
Basophils Absolute: 0.1 10*3/uL (ref 0.0–0.1)
Basophils Relative: 1 %
Eosinophils Absolute: 0.1 10*3/uL (ref 0.0–0.5)
Eosinophils Relative: 2 %
HCT: 39.4 % (ref 36.0–46.0)
Hemoglobin: 13.1 g/dL (ref 12.0–15.0)
Immature Granulocytes: 0 %
Lymphocytes Relative: 37 %
Lymphs Abs: 2.6 10*3/uL (ref 0.7–4.0)
MCH: 31.5 pg (ref 26.0–34.0)
MCHC: 33.2 g/dL (ref 30.0–36.0)
MCV: 94.7 fL (ref 80.0–100.0)
Monocytes Absolute: 0.4 10*3/uL (ref 0.1–1.0)
Monocytes Relative: 6 %
Neutro Abs: 3.8 10*3/uL (ref 1.7–7.7)
Neutrophils Relative %: 54 %
Platelets: 253 10*3/uL (ref 150–400)
RBC: 4.16 MIL/uL (ref 3.87–5.11)
RDW: 12.2 % (ref 11.5–15.5)
WBC: 7.1 10*3/uL (ref 4.0–10.5)
nRBC: 0 % (ref 0.0–0.2)

## 2021-12-03 LAB — COMPREHENSIVE METABOLIC PANEL
ALT: 21 U/L (ref 0–44)
AST: 30 U/L (ref 15–41)
Albumin: 4.4 g/dL (ref 3.5–5.0)
Alkaline Phosphatase: 96 U/L (ref 38–126)
Anion gap: 7 (ref 5–15)
BUN: 16 mg/dL (ref 6–20)
CO2: 27 mmol/L (ref 22–32)
Calcium: 9.2 mg/dL (ref 8.9–10.3)
Chloride: 99 mmol/L (ref 98–111)
Creatinine, Ser: 0.73 mg/dL (ref 0.44–1.00)
GFR, Estimated: >60 mL/min (ref >60)
Glucose, Bld: 135 mg/dL — ABNORMAL HIGH (ref 70–99)
Potassium: 3.6 mmol/L (ref 3.5–5.1)
Sodium: 133 mmol/L — ABNORMAL LOW (ref 135–145)
Total Bilirubin: 0.6 mg/dL (ref 0.3–1.2)
Total Protein: 7.3 g/dL (ref 6.5–8.1)

## 2021-12-03 LAB — TROPONIN I (HIGH SENSITIVITY)
Troponin I (High Sensitivity): 5 ng/L (ref <18)
Troponin I (High Sensitivity): 6 ng/L (ref <18)

## 2021-12-03 LAB — LIPASE, BLOOD: Lipase: 42 U/L (ref 11–51)

## 2021-12-03 LAB — D-DIMER, QUANTITATIVE: D-Dimer, Quant: <0.27 ug/mL-FEU (ref 0.00–0.50)

## 2021-12-03 MED ORDER — LORAZEPAM 2 MG/ML IJ SOLN: 0.5000 mg | Freq: Once | INTRAMUSCULAR | Status: DC

## 2021-12-03 MED ORDER — FAMOTIDINE 20 MG PO TABS
40.0000 mg | ORAL_TABLET | Freq: Once | ORAL | Status: AC
  Administered 2021-12-03: 40 mg via ORAL
  Filled 2021-12-03: qty 2

## 2021-12-03 MED ORDER — PANTOPRAZOLE SODIUM 20 MG PO TBEC: 20.0000 mg | DELAYED_RELEASE_TABLET | Freq: Two times a day (BID) | ORAL | 0 refills | Status: AC

## 2021-12-03 MED ORDER — HYOSCYAMINE SULFATE 0.125 MG SL SUBL
0.2500 mg | SUBLINGUAL_TABLET | Freq: Once | SUBLINGUAL | Status: AC
  Administered 2021-12-03: 0.25 mg via SUBLINGUAL
  Filled 2021-12-03: qty 2

## 2021-12-03 MED ORDER — LACTATED RINGERS IV BOLUS
1000.0000 mL | Freq: Once | INTRAVENOUS | Status: AC
  Administered 2021-12-03: 1000 mL via INTRAVENOUS

## 2021-12-03 MED ORDER — LACTATED RINGERS IV SOLN: INTRAVENOUS | Status: DC

## 2021-12-03 NOTE — ED Provider Notes (Signed)
Ashley [name]   CSN: [id number] Arrival date & time: 12/03/21  1221     History  Chief Complaint  Patient presents with   Chest Pain    Ashley [name] is a 51 y.o. female.  51 year old-year-old female presents to cute onset of chest discomfort.  Symptoms began earlier today and was treated with antacids without relief.  States that she feels as if it is a pressure type sensation.  Review of old chart shows that patient had a possible NSTEMI but that was felt to be due to myocarditis.  She had a heart catheterization which showed normal coronaries.  Patient does [name] feeling somewhat anxious.  Denies any fever cough.  Does have a history of GERD       Home Medications Prior to Admission medications   Medication Sig Start Date End Date Taking? Authorizing Provider  acetaminophen (TYLENOL) 325 MG tablet Take 650 mg by mouth every 6 (six) hours as needed for mild pain or headache.    [provider]  amLODipine (NORVASC) 5 MG tablet Take 5 mg by mouth daily.    [provider]  CRANBERRY PO Take 1 tablet by mouth daily.    [provider]  famotidine (PEPCID) 20 MG tablet Take 20 mg by mouth 2 (two) times daily.    [provider]  Magnesium 200 MG TABS Take 200 mg by mouth at bedtime.    [provider]  Multiple Vitamin (MULTIVITAMIN WITH MINERALS) TABS tablet Take 1 tablet by mouth daily.    [provider]  nitroGLYCERIN (NITROSTAT) 0.4 MG SL tablet Place 0.4 mg under the tongue every 5 (five) minutes as needed for chest pain. 09/03/21   [provider]  ondansetron (ZOFRAN) 4 MG tablet Take 1 tablet (4 mg total) by mouth every 8 (eight) hours as needed for nausea/vomiting 10/11/21   Noemi Chapel, MD  TURMERIC PO Take 1 capsule by mouth every other day.     [provider]  omeprazole (PRILOSEC) 20 MG capsule Take 1 capsule (20 mg total) by mouth daily. Patient  not taking: Reported on 09/03/2021 08/31/21 09/09/21        Allergies    Codeine, Doxycycline hyclate, and Promethazine-dm    Review of Systems   Review of Systems  All other systems reviewed and are negative.   Physical Exam Updated Vital Signs BP (!) 158/74 (BP Location: Left Arm)   Pulse (!) 104   Temp 97.9 F (36.6 C) (Oral)   Resp 17   LMP 08/01/2014 (Exact Date)   SpO2 100%  Physical Exam Vitals and nursing [name] reviewed.  Constitutional:      General: She is not in acute distress.    Appearance: Normal appearance. She is well-developed. She is not toxic-appearing.  HENT:     Head: Normocephalic and atraumatic.  Eyes:     General: Lids are normal.     Conjunctiva/sclera: Conjunctivae normal.     Pupils: Pupils are equal, round, and reactive to light.  Neck:     Thyroid: No thyroid mass.     Trachea: No tracheal deviation.  Cardiovascular:     Rate and Rhythm: Regular rhythm. Tachycardia present.     Heart sounds: Normal heart sounds. No murmur heard.    No gallop.  Pulmonary:     Effort: Pulmonary effort is normal. No respiratory distress.     Breath sounds: Normal breath sounds. No stridor. No decreased breath sounds, wheezing,  rhonchi or rales.  Abdominal:     General: There is no distension.     Palpations: Abdomen is soft.     Tenderness: There is no abdominal tenderness. There is no rebound.  Musculoskeletal:        General: No tenderness. Normal range of motion.     Cervical back: Normal range of motion and neck supple.  Skin:    General: Skin is warm and dry.     Findings: No abrasion or rash.  Neurological:     Mental Status: She is alert and oriented to person, place, and time. Mental status is at baseline.     GCS: GCS eye subscore is 4. GCS verbal subscore is 5. GCS motor subscore is 6.     Cranial Nerves: No cranial nerve deficit.     Sensory: No sensory deficit.     Motor: Motor function is intact.  Psychiatric:        Attention and Perception:  Attention normal.        Mood and Affect: Mood is anxious.        Speech: Speech normal.        Behavior: Behavior normal.     ED Results / Procedures / Treatments   Labs (all labs ordered are listed, but only abnormal results are displayed) Labs Reviewed  CBC WITH DIFFERENTIAL/PLATELET  D-DIMER, QUANTITATIVE  COMPREHENSIVE METABOLIC PANEL  LIPASE, BLOOD  TROPONIN I (HIGH SENSITIVITY)    EKG EKG Interpretation  Date/Time:  Thursday December 03 2021 12:23:45 EDT Ventricular Rate:  113 PR Interval:  167 QRS Duration: 102 QT Interval:  318 QTC Calculation: 436 R Axis:   185 Text Interpretation: Sinus tachycardia Right atrial enlargement Right axis deviation ST elev, probable normal early repol pattern Since last tracing rate faster Confirmed by Dorie Rank [phone]) on 12/03/2021 12:27:00 PM  Radiology No results found.  Procedures Procedures    Medications Ordered in ED Medications  lactated ringers bolus 1,000 mL (has no administration in time range)  lactated ringers infusion (has no administration in time range)  hyoscyamine (LEVSIN SL) SL tablet 0.25 mg (has no administration in time range)  famotidine (PEPCID) tablet 40 mg (has no administration in time range)  LORazepam (ATIVAN) injection 0.5 mg (has no administration in time range)    ED Course/ Medical Decision Making/ A&P                           Medical Decision Making Amount and/or Complexity of Data Reviewed Labs: ordered. Radiology: ordered.  Risk Prescription drug management.   Patient's cardiac enzymes x2 were negative.  EKG per my interpretation shows sinus tachycardia.  Concern for possible PE but patient has a negative D-dimer and therefore feel less likely.  Patient's x-ray interpretation shows no acute findings.  Patient very anxious here and was offered Ativan but is deferred.  Patient treated for possible GERD type symptoms and does feel better.  Do not feel that she needs admission at this  time.  She admits that she does have a history of panic attacks in the past.  Is unsure of this feels similar.  Do not feel that she is required admissions at this time.  Will place on medication for GERD        Final Clinical Impression(s) / ED Diagnoses Final diagnoses:  None    Rx / DC Orders ED Discharge Orders     None  Lacretia Leigh, MD 12/03/21 1515

## 2021-12-03 NOTE — ED Triage Notes (Signed)
Pt presents with c/o chest pain that started this morning. Pt reports a hx of an MI in June. Pt also reports that she took some pepto bismol for the pain today as she has a hx of acid reflux.

## 2022-05-03 ENCOUNTER — Other Ambulatory Visit: Payer: Self-pay

## 2022-05-03 ENCOUNTER — Emergency Department (HOSPITAL_COMMUNITY)
Admission: EM | Admit: 2022-05-03 | Discharge: 2022-05-03 | Disposition: A | Discharge status: Home / Self Care | Payer: BLUE CROSS/BLUE SHIELD | Attending: Emergency Medicine | Admitting: Emergency Medicine

## 2022-05-03 ENCOUNTER — Emergency Department (HOSPITAL_COMMUNITY): Payer: BLUE CROSS/BLUE SHIELD

## 2022-05-03 ENCOUNTER — Encounter (HOSPITAL_COMMUNITY): Payer: Self-pay

## 2022-05-03 DIAGNOSIS — R079 Chest pain, unspecified: Secondary | ICD-10-CM | POA: Diagnosis present

## 2022-05-03 DIAGNOSIS — Z79899 Other long term (current) drug therapy: Secondary | ICD-10-CM | POA: Insufficient documentation

## 2022-05-03 DIAGNOSIS — I10 Essential (primary) hypertension: Secondary | ICD-10-CM | POA: Insufficient documentation

## 2022-05-03 DIAGNOSIS — R0789 Other chest pain: Secondary | ICD-10-CM

## 2022-05-03 DIAGNOSIS — F419 Anxiety disorder, unspecified: Secondary | ICD-10-CM | POA: Insufficient documentation

## 2022-05-03 LAB — COMPREHENSIVE METABOLIC PANEL
ALT: 24 U/L (ref 0–44)
AST: 30 U/L (ref 15–41)
Albumin: 3.9 g/dL (ref 3.5–5.0)
Alkaline Phosphatase: 84 U/L (ref 38–126)
Anion gap: 6 (ref 5–15)
BUN: 15 mg/dL (ref 6–20)
CO2: 24 mmol/L (ref 22–32)
Calcium: 8.7 mg/dL — ABNORMAL LOW (ref 8.9–10.3)
Chloride: 98 mmol/L (ref 98–111)
Creatinine, Ser: 0.77 mg/dL (ref 0.44–1.00)
GFR, Estimated: >60 mL/min (ref >60)
Glucose, Bld: 155 mg/dL — ABNORMAL HIGH (ref 70–99)
Potassium: 3.6 mmol/L (ref 3.5–5.1)
Sodium: 128 mmol/L — ABNORMAL LOW (ref 135–145)
Total Bilirubin: 0.5 mg/dL (ref 0.3–1.2)
Total Protein: 6.1 g/dL — ABNORMAL LOW (ref 6.5–8.1)

## 2022-05-03 LAB — CBC WITH DIFFERENTIAL/PLATELET
Abs Immature Granulocytes: 0.01 10*3/uL (ref 0.00–0.07)
Basophils Absolute: 0 10*3/uL (ref 0.0–0.1)
Basophils Relative: 1 %
Eosinophils Absolute: 0.1 10*3/uL (ref 0.0–0.5)
Eosinophils Relative: 1 %
HCT: 34.2 % — ABNORMAL LOW (ref 36.0–46.0)
Hemoglobin: 11.3 g/dL — ABNORMAL LOW (ref 12.0–15.0)
Immature Granulocytes: 0 %
Lymphocytes Relative: 32 %
Lymphs Abs: 1.4 10*3/uL (ref 0.7–4.0)
MCH: 30.3 pg (ref 26.0–34.0)
MCHC: 33 g/dL (ref 30.0–36.0)
MCV: 91.7 fL (ref 80.0–100.0)
Monocytes Absolute: 0.2 10*3/uL (ref 0.1–1.0)
Monocytes Relative: 5 %
Neutro Abs: 2.7 10*3/uL (ref 1.7–7.7)
Neutrophils Relative %: 61 %
Platelets: 176 10*3/uL (ref 150–400)
RBC: 3.73 MIL/uL — ABNORMAL LOW (ref 3.87–5.11)
RDW: 12.5 % (ref 11.5–15.5)
WBC: 4.4 10*3/uL (ref 4.0–10.5)
nRBC: 0 % (ref 0.0–0.2)

## 2022-05-03 LAB — TROPONIN I (HIGH SENSITIVITY): Troponin I (High Sensitivity): 3 ng/L (ref <18)

## 2022-05-03 LAB — HCG, QUANTITATIVE, PREGNANCY: hCG, Beta Chain, Quant, S: 3 m[IU]/mL (ref <5)

## 2022-05-03 MED ORDER — LORAZEPAM 2 MG/ML IJ SOLN
0.5000 mg | Freq: Once | INTRAMUSCULAR | Status: AC
  Administered 2022-05-03: 0.5 mg via INTRAVENOUS
  Filled 2022-05-03: qty 1

## 2022-05-03 MED ORDER — HYDROXYZINE HCL 10 MG PO TABS: 25.0000 mg | ORAL_TABLET | Freq: Four times a day (QID) | ORAL | 1 refills | Status: AC | PRN

## 2022-05-03 NOTE — ED Triage Notes (Signed)
Patient was working and started having chest pain. Stated it started around 1530ish. Has history of heart issues. Took '325mg'$  ASA.

## 2022-05-03 NOTE — Discharge Instructions (Signed)
Take for Vistaril as needed for stress and follow-up with your family doctor either the end of this week or the beginning of next week

## 2022-05-03 NOTE — ED Notes (Signed)
Pt calling for ride home 

## 2022-05-05 NOTE — ED Provider Notes (Signed)
EMERGENCY DEPARTMENT AT [facility] Provider Note   CSN: [id number] Arrival date & time: 05/03/22  1613     History  Chief Complaint  Patient presents with   Chest Pain    Ashley [name] is a 52 y.o. female.  Patient complains of chest pain.  She states she has a history of anxiety and has not had this worked up before.  She thinks this is anxiety.  Patient also has a history of hypertension  The history is provided by the patient and medical records. No language interpreter was used.  Chest Pain Pain location:  L chest Pain quality: aching   Pain radiates to:  Does not radiate Pain severity:  Mild Onset quality:  Sudden Timing:  Intermittent Progression:  Waxing and waning Chronicity:  Recurrent Relieved by:  Nothing Worsened by:  Nothing Ineffective treatments:  None tried Associated symptoms: no abdominal pain, no back pain, no cough, no fatigue and no headache        Home Medications Prior to Admission medications   Medication Sig Start Date End Date Taking? Authorizing Provider  hydrOXYzine (ATARAX) 10 MG tablet Take 2.5 tablets (25 mg total) by mouth every 6 (six) hours as needed for anxiety. 05/03/22  Yes Milton Ferguson, MD  acetaminophen (TYLENOL) 325 MG tablet Take 650 mg by mouth every 6 (six) hours as needed for mild pain or headache.    [provider]  amLODipine (NORVASC) 5 MG tablet Take 5 mg by mouth daily.    [provider]  CRANBERRY PO Take 1 tablet by mouth daily.    [provider]  famotidine (PEPCID) 20 MG tablet Take 20 mg by mouth 2 (two) times daily.    [provider]  Magnesium 200 MG TABS Take 200 mg by mouth at bedtime.    [provider]  Multiple Vitamin (MULTIVITAMIN WITH MINERALS) TABS tablet Take 1 tablet by mouth daily.    [provider]  nitroGLYCERIN (NITROSTAT) 0.4 MG SL tablet Place 0.4 mg under the tongue every 5 (five) minutes as needed for chest  pain. 09/03/21   [provider]  ondansetron (ZOFRAN) 4 MG tablet Take 1 tablet (4 mg total) by mouth every 8 (eight) hours as needed for nausea/vomiting 10/11/21   Noemi Chapel, MD  pantoprazole (PROTONIX) 20 MG tablet Take 1 tablet (20 mg total) by mouth 2 (two) times daily. 12/03/21   Lacretia Leigh, MD  TURMERIC PO Take 1 capsule by mouth every other day.     [provider]  omeprazole (PRILOSEC) 20 MG capsule Take 1 capsule (20 mg total) by mouth daily. Patient not taking: Reported on 09/03/2021 08/31/21 09/09/21        Allergies    Codeine, Doxycycline hyclate, and Promethazine-dm    Review of Systems   Review of Systems  Constitutional:  Negative for appetite change and fatigue.  HENT:  Negative for congestion, ear discharge and sinus pressure.   Eyes:  Negative for discharge.  Respiratory:  Negative for cough.   Cardiovascular:  Positive for chest pain.  Gastrointestinal:  Negative for abdominal pain and diarrhea.  Genitourinary:  Negative for frequency and hematuria.  Musculoskeletal:  Negative for back pain.  Skin:  Negative for rash.  Neurological:  Negative for seizures and headaches.  Psychiatric/Behavioral:  Negative for hallucinations.     Physical Exam Updated Vital Signs BP 108/67   Pulse 87   Temp 98 F (36.7 C) (Oral)   Resp  15   Ht '5\' 6"'$  (1.676 m)   Wt 62.6 kg   LMP 08/01/2014 (Exact Date)   SpO2 94%   BMI 22.27 kg/m  Physical Exam Vitals reviewed.  Constitutional:      Appearance: She is well-developed.  HENT:     Head: Normocephalic.     Nose: Nose normal.  Eyes:     General: No scleral icterus.    Conjunctiva/sclera: Conjunctivae normal.  Neck:     Thyroid: No thyromegaly.  Cardiovascular:     Rate and Rhythm: Normal rate and regular rhythm.     Heart sounds: No murmur heard.    No friction rub. No gallop.  Pulmonary:     Breath sounds: No stridor. No wheezing or rales.  Chest:     Chest wall: No tenderness.  Abdominal:      General: There is no distension.     Tenderness: There is no abdominal tenderness. There is no rebound.  Musculoskeletal:        General: Normal range of motion.     Cervical back: Neck supple.  Lymphadenopathy:     Cervical: No cervical adenopathy.  Skin:    Findings: No erythema or rash.  Neurological:     Mental Status: She is alert and oriented to person, place, and time.     Motor: No abnormal muscle tone.     Coordination: Coordination normal.  Psychiatric:        Behavior: Behavior normal.     ED Results / Procedures / Treatments   Labs (all labs ordered are listed, but only abnormal results are displayed) Labs Reviewed  CBC WITH DIFFERENTIAL/PLATELET - Abnormal; Notable for the following components:      Result Value   RBC 3.73 (*)    Hemoglobin 11.3 (*)    HCT 34.2 (*)    All other components within normal limits  COMPREHENSIVE METABOLIC PANEL - Abnormal; Notable for the following components:   Sodium 128 (*)    Glucose, Bld 155 (*)    Calcium 8.7 (*)    Total Protein 6.1 (*)    All other components within normal limits  HCG, QUANTITATIVE, PREGNANCY  TROPONIN I (HIGH SENSITIVITY)  TROPONIN I (HIGH SENSITIVITY)    EKG EKG Interpretation  Date/Time:  Monday May 03 2022 16:21:12 EST Ventricular Rate:  99 PR Interval:  157 QRS Duration: 106 QT Interval:  354 QTC Calculation: 455 R Axis:   111 Text Interpretation: Sinus rhythm Right atrial enlargement Right axis deviation Confirmed by Milton Ferguson [phone]) on 05/03/2022 4:49:17 PM  Radiology DG Chest Port 1 View  Result Date: 05/03/2022 CLINICAL DATA:  Chest pain.  Weakness. EXAM: PORTABLE CHEST 1 VIEW COMPARISON:  01/20/2022 FINDINGS: Single view of the chest was obtained. Both lungs are clear. Heart and mediastinum are within normal limits. Trachea is midline. Negative for a pneumothorax. No acute bone abnormality. IMPRESSION: No active disease. Electronically Signed   By: Markus Daft M.D.   On: 05/03/2022  16:43    Procedures Procedures    Medications Ordered in ED Medications  LORazepam (ATIVAN) injection 0.5 mg (0.5 mg Intravenous Given 05/03/22 1659)    ED Course/ Medical Decision Making/ A&P                             Medical Decision Making Amount and/or Complexity of Data Reviewed Labs: ordered. Radiology: ordered.  Risk Prescription drug management.    This patient presents  to the ED for concern of chest pain, this involves an extensive number of treatment options, and is a complaint that carries with it a high risk of complications and morbidity.  The differential diagnosis includes anxiety, coronary artery disease   Co morbidities that complicate the patient evaluation  Hypertension and anxiety   Additional history obtained:  Additional history obtained from patient External records from outside source obtained and reviewed including hospital records   Lab Tests:  I Ordered, and personally interpreted labs.  The pertinent results include: Troponin negative   Imaging Studies ordered:  I ordered imaging studies including chest x-ray I independently visualized and interpreted imaging which showed negative I agree with the radiologist interpretation   Cardiac Monitoring: / EKG:  The patient was maintained on a cardiac monitor.  I personally viewed and interpreted the cardiac monitored which showed an underlying rhythm of: Normal sinus rhythm   Consultations Obtained: No consultant Problem List / ED Course / Critical interventions / Medication management  With ID, chest pain, hypertension I ordered medication including Ativan for anxiety Reevaluation of the patient after these medicines showed that the patient improved I have reviewed the patients home medicines and have made adjustments as needed   Social Determinants of Health:  Patient is a Administrator, Civil Service / Admission - Considered:  None   Pain most likely related to anxiety.  She is given  a lower dose of Vistaril to take for anxiety and she will follow-up with her PCP        Final Clinical Impression(s) / ED Diagnoses Final diagnoses:  Atypical chest pain  Anxiety    Rx / DC Orders ED Discharge Orders          Ordered    hydrOXYzine (ATARAX) 10 MG tablet  Every 6 hours PRN        05/03/22 1754              Milton Ferguson, MD 05/05/22 1235

## 2022-08-02 ENCOUNTER — Other Ambulatory Visit (HOSPITAL_COMMUNITY): Payer: Self-pay

## 2023-05-12 ENCOUNTER — Ambulatory Visit: Payer: Self-pay

## 2023-05-12 ENCOUNTER — Other Ambulatory Visit: Payer: Self-pay | Admitting: Family Medicine

## 2023-05-12 DIAGNOSIS — S9782XA Crushing injury of left foot, initial encounter: Secondary | ICD-10-CM

## 2023-09-13 ENCOUNTER — Other Ambulatory Visit (HOSPITAL_COMMUNITY): Payer: Self-pay
